# Patient Record
Sex: Female | Born: 1985 | Race: White | Hispanic: No | Marital: Single | Smoking: Never smoker
Health system: Southern US, Community
[De-identification: ages and names within clinical notes are randomized; demographics above are authoritative.]

## PROBLEM LIST (undated history)

## (undated) DIAGNOSIS — F429 Obsessive-compulsive disorder, unspecified: Secondary | ICD-10-CM

## (undated) DIAGNOSIS — D509 Iron deficiency anemia, unspecified: Secondary | ICD-10-CM

## (undated) DIAGNOSIS — Z9889 Other specified postprocedural states: Secondary | ICD-10-CM

## (undated) HISTORY — DX: Iron deficiency anemia, unspecified: D50.9

## (undated) HISTORY — PX: LASIK: SHX215

## (undated) HISTORY — DX: Other specified postprocedural states: Z98.890

## (undated) HISTORY — PX: APPENDECTOMY: SHX54

## (undated) HISTORY — DX: Obsessive-compulsive disorder, unspecified: F42.9

---

## 2016-12-20 DIAGNOSIS — Z760 Encounter for issue of repeat prescription: Secondary | ICD-10-CM | POA: Diagnosis not present

## 2017-03-12 DIAGNOSIS — Z760 Encounter for issue of repeat prescription: Secondary | ICD-10-CM | POA: Diagnosis not present

## 2017-03-25 ENCOUNTER — Ambulatory Visit: Payer: Self-pay | Admitting: Family Medicine

## 2017-04-23 ENCOUNTER — Encounter: Payer: Self-pay | Admitting: Family Medicine

## 2017-04-23 ENCOUNTER — Ambulatory Visit (INDEPENDENT_AMBULATORY_CARE_PROVIDER_SITE_OTHER): Payer: 59 | Admitting: Family Medicine

## 2017-04-23 VITALS — BP 122/82 | HR 87 | Ht 62.0 in | Wt 111.0 lb

## 2017-04-23 DIAGNOSIS — Z0000 Encounter for general adult medical examination without abnormal findings: Secondary | ICD-10-CM | POA: Diagnosis not present

## 2017-04-23 DIAGNOSIS — Z0289 Encounter for other administrative examinations: Secondary | ICD-10-CM | POA: Diagnosis not present

## 2017-04-23 MED ORDER — MEDROXYPROGESTERONE ACETATE 150 MG/ML IM SUSP: 150.0000 mg | INTRAMUSCULAR | 3 refills | Status: DC

## 2017-04-23 NOTE — Progress Notes (Signed)
Name: Kathleen Small   MRN: [id number]    DOB: 07/30/1985   Date:04/23/2017       Progress Note  Subjective  Chief Complaint  Chief Complaint  Patient presents with  . Establish Care    new to area from [place]- just establishing    Patient to establish care with new family physician.    No problem-specific Assessment & Plan notes found for this encounter.   Past Medical History:  Diagnosis Date  . Hx of abdominal surgery    malrotated abdomen    Past Surgical History:  Procedure Laterality Date  . APPENDECTOMY    . LASIK Bilateral     Family History  Problem Relation Age of Onset  . Diabetes Father   . Hypertension Father   . Heart disease Father   . Hypertension Maternal Grandmother   . Stroke Maternal Grandmother   . Diabetes Maternal Grandfather   . Hypertension Paternal Grandmother     Social History   Socioeconomic History  . Marital status: Single    Spouse name: Not on file  . Number of children: Not on file  . Years of education: Not on file  . Highest education level: Not on file  Social Needs  . Financial resource strain: Not on file  . Food insecurity - worry: Not on file  . Food insecurity - inability: Not on file  . Transportation needs - medical: Not on file  . Transportation needs - non-medical: Not on file  Occupational History  . Not on file  Tobacco Use  . Smoking status: Never Smoker  . Smokeless tobacco: Never Used  Substance and Sexual Activity  . Alcohol use: Yes  . Drug use: No  . Sexual activity: No  Other Topics Concern  . Not on file  Social History Narrative  . Not on file    Allergies  Allergen Reactions  . Reglan [Metoclopramide]     Outpatient Medications Prior to Visit  Medication Sig Dispense Refill  . medroxyPROGESTERone (DEPO-PROVERA) 150 MG/ML injection Inject 150 mg into the muscle every 3 (three) months.     No facility-administered medications prior to visit.     Review of Systems  Constitutional:  Negative for chills, diaphoresis, fever, malaise/fatigue and weight loss.  HENT: Negative for congestion, ear discharge, ear pain, hearing loss, nosebleeds, sinus pain, sore throat and tinnitus.   Eyes: Negative for blurred vision, double vision, photophobia, pain, discharge and redness.  Respiratory: Negative for cough, hemoptysis, sputum production, shortness of breath, wheezing and stridor.   Cardiovascular: Negative for chest pain, palpitations, orthopnea, claudication, leg swelling and PND.  Gastrointestinal: Negative for abdominal pain, blood in stool, constipation, diarrhea, heartburn, melena, nausea and vomiting.  Genitourinary: Negative for dysuria, flank pain, frequency, hematuria and urgency.  Musculoskeletal: Negative for back pain, joint pain, myalgias and neck pain.  Skin: Negative for itching and rash.  Neurological: Negative for dizziness, tingling, tremors, sensory change, speech change, focal weakness, seizures, loss of consciousness, weakness and headaches.  Endo/Heme/Allergies: Negative for environmental allergies and polydipsia. Does not bruise/bleed easily.  Psychiatric/Behavioral: Negative for depression and suicidal ideas. The patient is not nervous/anxious and does not have insomnia.      Objective  Vitals:   04/23/17 1525  BP: 122/82  Pulse: 87  SpO2: 100%  Weight: 111 lb (50.3 kg)  Height: 5\' 2"  (1.575 m)    Physical Exam  Constitutional: She is well-developed, well-nourished, and in no distress. No distress.  HENT:  Head: Normocephalic and  atraumatic.  Right Ear: External ear normal.  Left Ear: External ear normal.  Nose: Nose normal.  Mouth/Throat: Oropharynx is clear and moist.  Eyes: Conjunctivae and EOM are normal. Pupils are equal, round, and reactive to light. Right eye exhibits no discharge. Left eye exhibits no discharge.  Neck: Normal range of motion. Neck supple. No JVD present. No thyromegaly present.  Cardiovascular: Normal rate, regular  rhythm, S1 normal, S2 normal and intact distal pulses. Exam reveals no gallop, no S3, no S4 and no friction rub.  Murmur heard.  Systolic murmur is present with a grade of 1/6. Pulmonary/Chest: Effort normal and breath sounds normal. She has no wheezes. She has no rales.  Abdominal: Soft. Bowel sounds are normal. She exhibits no mass. There is no tenderness. There is no guarding.  Musculoskeletal: Normal range of motion. She exhibits no edema.  Lymphadenopathy:    She has no cervical adenopathy.  Neurological: She is alert.  Skin: Skin is warm and dry. She is not diaphoretic.  Psychiatric: Mood and affect normal.  Nursing note and vitals reviewed.     Assessment & Plan  Problem List Items Addressed This Visit    None    Visit Diagnoses    Encounter for physical examination related to employment    -  Primary   Encounter for medical examination to establish care          Meds ordered this encounter  Medications  . medroxyPROGESTERone (DEPO-PROVERA) 150 MG/ML injection    Sig: Inject 1 mL (150 mg total) into the muscle every 3 (three) months.    Dispense:  1 mL    Refill:  3      Dr. [facility]  04/23/17

## 2017-05-08 ENCOUNTER — Ambulatory Visit (INDEPENDENT_AMBULATORY_CARE_PROVIDER_SITE_OTHER): Payer: 59 | Admitting: Obstetrics and Gynecology

## 2017-05-08 ENCOUNTER — Encounter: Payer: Self-pay | Admitting: Obstetrics and Gynecology

## 2017-05-08 VITALS — BP 104/64 | HR 70 | Ht 62.0 in | Wt 110.0 lb

## 2017-05-08 DIAGNOSIS — Z3009 Encounter for other general counseling and advice on contraception: Secondary | ICD-10-CM | POA: Diagnosis not present

## 2017-05-08 DIAGNOSIS — Z0000 Encounter for general adult medical examination without abnormal findings: Secondary | ICD-10-CM | POA: Diagnosis not present

## 2017-05-08 DIAGNOSIS — Z124 Encounter for screening for malignant neoplasm of cervix: Secondary | ICD-10-CM | POA: Diagnosis not present

## 2017-05-08 NOTE — Progress Notes (Signed)
Gynecology Annual Exam  PCP: Duanne LimerickJones, Deanna C, MD  Chief Complaint:  Chief Complaint  Patient presents with  . Gynecologic Exam    NP history of abnormal pap    History of Present Illness: Patient is a 32 y.o. G0P0000 presents for annual exam. The patient has no complaints today. She would like to follow up an abnormal [place]. She has tried but been unable to obtain results.   LMP: No LMP recorded. Patient has had an injection. Average Interval: amenorrheic on depo Duration of flow: 0 days  Heavy Menses: no Clots: no Intermenstrual Bleeding: no Postcoital Bleeding: no Dysmenorrhea: no   The patient is not sexually active. She currently uses Depo-Provera injections for contraception. She denies dyspareunia.  The patient does not perform self breast exams.  There is notable family history of breast or ovarian cancer in her family (paternal grandmother with breast cancer in 3670s)  The patient wears seatbelts: yes.   The patient has regular exercise: yes.    The patient denies current symptoms of depression.    Review of Systems: Review of Systems  Constitutional: Negative for chills, fever, malaise/fatigue and weight loss.  HENT: Negative for congestion, hearing loss and sinus pain.   Eyes: Negative for blurred vision and double vision.  Respiratory: Negative for cough, sputum production, shortness of breath and wheezing.   Cardiovascular: Negative for chest pain, palpitations, orthopnea and leg swelling.  Gastrointestinal: Negative for abdominal pain, constipation, diarrhea, nausea and vomiting.  Genitourinary: Negative for dysuria, flank pain, frequency, hematuria and urgency.  Musculoskeletal: Negative for back pain, falls and joint pain.  Skin: Negative for itching and rash.  Neurological: Negative for dizziness and headaches.  Psychiatric/Behavioral: Negative for depression, substance abuse and suicidal ideas. The patient is not  nervous/anxious.     Past Medical History:  Past Medical History:  Diagnosis Date  . Hx of abdominal surgery    malrotated abdomen    Past Surgical History:  Past Surgical History:  Procedure Laterality Date  . APPENDECTOMY    . LASIK Bilateral     Gynecologic History:  No LMP recorded. Patient has had an injection. Contraception: Depo-Provera injections Last [place]: [place] were[place] of [place]. Was advised to have colposcopy. Obstetric History: G0P0000  Family History:  Family History  Problem Relation Age of Onset  . Diabetes Father   . Hypertension Father   . Heart disease Father   . Hypertension Maternal Grandmother   . Stroke Maternal Grandmother   . Diabetes Maternal Grandfather   . Hypertension Paternal Grandmother     Social History:  Social History   Socioeconomic History  . Marital status: Single    Spouse name: Not on file  . Number of children: Not on file  . Years of education: Not on file  . Highest education level: Not on file  Social Needs  . Financial resource strain: Not on file  . Food insecurity - worry: Not on file  . Food insecurity - inability: Not on file  . Transportation needs - medical: Not on file  . Transportation needs - non-medical: Not on file  Occupational History  . Not on file  Tobacco Use  . Smoking status: Never Smoker  . Smokeless tobacco: Never Used  Substance and Sexual Activity  . Alcohol use: Yes  . Drug use: No  . Sexual activity: Not Currently    Birth control/protection: Injection  Other  Topics Concern  . Not on file  Social History Narrative  . Not on file    Allergies:  Allergies  Allergen Reactions  . Reglan [Metoclopramide] Other (See Comments)    Formication (felt like insects crawling all over her)    Medications: Prior to Admission medications   Medication Sig Start Date End Date Taking? Authorizing Provider  medroxyPROGESTERone (DEPO-PROVERA) 150 MG/ML  injection Inject 1 mL (150 mg total) into the muscle every 3 (three) months. 04/23/17  Yes Duanne Limerick, MD    Physical Exam Vitals: Blood pressure 104/64, pulse 70, height 5\' 2"  (1.575 m), weight 110 lb (49.9 kg).  Physical Exam  Constitutional: She is oriented to person, place, and time. She appears well-developed.  Genitourinary: Vagina normal and uterus normal. There is no lesion on the right labia. There is no lesion on the left labia. Vagina exhibits no lesion. Right adnexum does not display mass. Left adnexum does not display mass. Cervix does not exhibit motion tenderness.  Genitourinary Comments: Very small cervix and uterus.  HENT:  Head: Normocephalic and atraumatic.  Eyes: EOM are normal.  Neck: Neck supple. No thyromegaly present.  Cardiovascular: Normal rate, regular rhythm and normal heart sounds.  Pulmonary/Chest: Effort normal and breath sounds normal. Right breast exhibits no inverted nipple, no mass, no nipple discharge and no skin change. Left breast exhibits no inverted nipple, no mass, no nipple discharge and no skin change.  Abdominal: Soft. Bowel sounds are normal. She exhibits no distension and no mass.  Neurological: She is alert and oriented to person, place, and time.  Skin: Skin is warm and dry.  Psychiatric: She has a normal mood and affect. Her behavior is normal. Judgment and thought content normal.  Vitals reviewed.    Female chaperone present for pelvic and breast  portions of the physical exam  Assessment: 32 y.o. G0P0000 routine annual exam  Plan: Problem List Items Addressed This Visit    None    Visit Diagnoses    Screening for cervical cancer    -  Primary   Relevant Orders   PapIG, HPV, rfx 16/18      1) STI screening was offered and declined  2)Cervical Cancer Screening- pap today  3) Contraception - Education given regarding options for contraception, including injectable contraception, IUD placement. Patient desires to continue  Depo therapy.  4) Routine healthcare maintenance including cholesterol, diabetes screening discussed managed by PCP

## 2017-05-10 LAB — PAPIG, HPV, RFX 16/18
HPV, HIGH-RISK: NEGATIVE
PAP SMEAR COMMENT: 0

## 2017-06-03 ENCOUNTER — Other Ambulatory Visit: Payer: Self-pay

## 2017-06-03 DIAGNOSIS — Z3042 Encounter for surveillance of injectable contraceptive: Secondary | ICD-10-CM

## 2017-06-03 MED ORDER — MEDROXYPROGESTERONE ACETATE 150 MG/ML IM SUSP: 150.0000 mg | INTRAMUSCULAR | 3 refills | Status: DC

## 2018-02-03 ENCOUNTER — Ambulatory Visit: Payer: 59 | Admitting: Family Medicine

## 2018-02-07 ENCOUNTER — Ambulatory Visit (INDEPENDENT_AMBULATORY_CARE_PROVIDER_SITE_OTHER): Payer: 59 | Admitting: Family Medicine

## 2018-02-07 ENCOUNTER — Ambulatory Visit: Payer: 59 | Admitting: Family Medicine

## 2018-02-07 ENCOUNTER — Encounter: Payer: Self-pay | Admitting: Family Medicine

## 2018-02-07 VITALS — BP 130/82 | HR 72 | Ht 62.0 in | Wt 107.0 lb

## 2018-02-07 DIAGNOSIS — F419 Anxiety disorder, unspecified: Secondary | ICD-10-CM

## 2018-02-07 MED ORDER — SERTRALINE HCL 25 MG PO TABS: 25.0000 mg | ORAL_TABLET | Freq: Every day | ORAL | 1 refills | Status: DC

## 2018-02-07 NOTE — Progress Notes (Signed)
Date:  02/07/2018   Name:  Kathleen Small   DOB:  [birth year]   MRN:  [id number]   Chief Complaint: Anxiety (work stressors and social anxiety PHQ=2. never been on med in the past) Anxiety  Presents for initial visit. Onset was 1 to 6 months ago. The problem has been gradually improving. Symptoms include depressed mood, excessive worry, irritability and nervous/anxious behavior. Patient reports no chest pain, compulsions, confusion, decreased concentration, dizziness, feeling of choking, hyperventilation, insomnia, nausea, panic or shortness of breath. The severity of symptoms is mild. The symptoms are aggravated by work stress.       Review of Systems  Constitutional: Positive for irritability. Negative for chills, fatigue, fever and unexpected weight change.  HENT: Negative for congestion, ear discharge, ear pain, rhinorrhea, sinus pressure, sneezing and sore throat.   Eyes: Negative for photophobia, pain, discharge, redness and itching.  Respiratory: Negative for cough, shortness of breath, wheezing and stridor.   Cardiovascular: Negative for chest pain.  Gastrointestinal: Negative for abdominal pain, blood in stool, constipation, diarrhea, nausea and vomiting.  Endocrine: Negative for cold intolerance, heat intolerance, polydipsia, polyphagia and polyuria.  Genitourinary: Negative for dysuria, flank pain, frequency, hematuria, menstrual problem, pelvic pain, urgency, vaginal bleeding and vaginal discharge.  Musculoskeletal: Negative for arthralgias, back pain and myalgias.  Skin: Negative for rash.  Allergic/Immunologic: Negative for environmental allergies and food allergies.  Neurological: Negative for dizziness, weakness, light-headedness, numbness and headaches.  Hematological: Negative for adenopathy. Does not bruise/bleed easily.  Psychiatric/Behavioral: Negative for confusion, decreased concentration and dysphoric mood. The patient is nervous/anxious. The patient does not  have insomnia.     There are no active problems to display for this patient.   Allergies  Allergen Reactions  . Reglan [Metoclopramide] Other (See Comments)    Formication (felt like insects crawling all over her)    Past Surgical History:  Procedure Laterality Date  . APPENDECTOMY    . LASIK Bilateral     Social History   Tobacco Use  . Smoking status: Never Smoker  . Smokeless tobacco: Never Used  Substance Use Topics  . Alcohol use: Yes  . Drug use: No     Medication list has been reviewed and updated.  Current Meds  Medication Sig  . medroxyPROGESTERone (DEPO-PROVERA) 150 MG/ML injection Inject 1 mL (150 mg total) into the muscle every 3 (three) months.    PHQ 2/9 Scores 02/07/2018 04/23/2017  PHQ - 2 Score 0 0  PHQ- 9 Score 2 0    Physical Exam  Constitutional: She is oriented to person, place, and time. She appears well-developed and well-nourished.  HENT:  Head: Normocephalic.  Right Ear: External ear normal.  Left Ear: External ear normal.  Mouth/Throat: Oropharynx is clear and moist.  Eyes: Pupils are equal, round, and reactive to light. Conjunctivae and EOM are normal. Lids are everted and swept, no foreign bodies found. Left eye exhibits no hordeolum. No foreign body present in the left eye. Right conjunctiva is not injected. Left conjunctiva is not injected. No scleral icterus.  Neck: Normal range of motion. Neck supple. No JVD present. No tracheal deviation present. No thyromegaly present.  Cardiovascular: Normal rate, regular rhythm, normal heart sounds and intact distal pulses. Exam reveals no gallop and no friction rub.  No murmur heard. Pulmonary/Chest: Effort normal and breath sounds normal. No respiratory distress. She has no wheezes. She has no rales.  Abdominal: Soft. Bowel sounds are normal. She exhibits no mass. There is no hepatosplenomegaly. There  is no tenderness. There is no rebound and no guarding.  Musculoskeletal: Normal range of  motion. She exhibits no edema or tenderness.  Lymphadenopathy:    She has no cervical adenopathy.  Neurological: She is alert and oriented to person, place, and time. She has normal strength. She displays normal reflexes. No cranial nerve deficit.  Skin: Skin is warm. No rash noted.  Psychiatric: She has a normal mood and affect. Her mood appears not anxious. She does not exhibit a depressed mood.  Nursing note and vitals reviewed.   BP 130/82   Pulse 72   Ht 5\' 2"  (1.575 m)   Wt 107 lb (48.5 kg)   BMI 19.57 kg/m   Assessment and Plan:  1. Anxiety New onset Initiate sertraline 12.5 daily for 2 weeks then 1 a day The patient is asked to return in  6-8 weeks.   Dr. [facility]  02/07/2018

## 2018-03-17 ENCOUNTER — Encounter: Payer: Self-pay | Admitting: Family Medicine

## 2018-05-08 ENCOUNTER — Other Ambulatory Visit: Payer: Self-pay | Admitting: Obstetrics and Gynecology

## 2018-05-08 ENCOUNTER — Other Ambulatory Visit: Payer: Self-pay | Admitting: Family Medicine

## 2018-05-08 DIAGNOSIS — Z30019 Encounter for initial prescription of contraceptives, unspecified: Secondary | ICD-10-CM

## 2018-05-08 MED ORDER — MEDROXYPROGESTERONE ACETATE 150 MG/ML IM SUSP: 150.0000 mg | INTRAMUSCULAR | 3 refills | Status: DC

## 2018-11-07 ENCOUNTER — Other Ambulatory Visit: Payer: Self-pay

## 2018-11-07 ENCOUNTER — Encounter: Payer: Self-pay | Admitting: Family Medicine

## 2018-11-07 ENCOUNTER — Ambulatory Visit (INDEPENDENT_AMBULATORY_CARE_PROVIDER_SITE_OTHER): Payer: 59 | Admitting: Family Medicine

## 2018-11-07 VITALS — BP 120/80 | HR 80 | Ht 64.0 in | Wt 107.0 lb

## 2018-11-07 DIAGNOSIS — Z8659 Personal history of other mental and behavioral disorders: Secondary | ICD-10-CM

## 2018-11-07 DIAGNOSIS — Z0000 Encounter for general adult medical examination without abnormal findings: Secondary | ICD-10-CM | POA: Diagnosis not present

## 2018-11-07 DIAGNOSIS — F419 Anxiety disorder, unspecified: Secondary | ICD-10-CM | POA: Diagnosis not present

## 2018-11-07 MED ORDER — SERTRALINE HCL 25 MG PO TABS: 25.0000 mg | ORAL_TABLET | Freq: Every day | ORAL | 1 refills | Status: DC

## 2018-11-07 NOTE — Progress Notes (Signed)
Date:  11/07/2018   Name:  Kathleen Small   DOB:  18-Oct-1985   MRN:  [id number]   Chief Complaint: Annual Exam (no pap)  Patient is a 33 year old female who presents for a comprehensive physical exam. The patient reports the following problems: anxiety/OCD. Health maintenance has been reviewed up to date. Anxiety Presents for follow-up visit. Symptoms include compulsions. Patient reports no confusion, decreased concentration, depressed mood, dizziness, excessive worry, insomnia, irritability, nausea, nervous/anxious behavior, shortness of breath or suicidal ideas. Symptoms occur most days.      Review of Systems  Constitutional: Negative.  Negative for chills, fatigue, fever, irritability and unexpected weight change.  HENT: Negative for congestion, ear discharge, ear pain, rhinorrhea, sinus pressure, sneezing and sore throat.   Eyes: Negative for photophobia, pain, discharge, redness and itching.  Respiratory: Negative for cough, shortness of breath, wheezing and stridor.   Gastrointestinal: Negative for abdominal pain, blood in stool, constipation, diarrhea, nausea and vomiting.  Endocrine: Negative for cold intolerance, heat intolerance, polydipsia, polyphagia and polyuria.  Genitourinary: Negative for dysuria, flank pain, frequency, hematuria, menstrual problem, pelvic pain, urgency, vaginal bleeding and vaginal discharge.  Musculoskeletal: Negative for arthralgias, back pain and myalgias.  Skin: Negative for rash.  Allergic/Immunologic: Negative for environmental allergies and food allergies.  Neurological: Negative for dizziness, weakness, light-headedness, numbness and headaches.  Hematological: Negative for adenopathy. Does not bruise/bleed easily.  Psychiatric/Behavioral: Negative for confusion, decreased concentration, dysphoric mood and suicidal ideas. The patient is not nervous/anxious and does not have insomnia.     There are no active problems to display for this  patient.   Allergies  Allergen Reactions  . Reglan [Metoclopramide] Other (See Comments)    Formication (felt like insects crawling all over her)    Past Surgical History:  Procedure Laterality Date  . APPENDECTOMY    . LASIK Bilateral     Social History   Tobacco Use  . Smoking status: Never Smoker  . Smokeless tobacco: Never Used  Substance Use Topics  . Alcohol use: Yes  . Drug use: No     Medication list has been reviewed and updated.  Current Meds  Medication Sig  . medroxyPROGESTERone (DEPO-PROVERA) 150 MG/ML injection Inject 1 mL (150 mg total) into the muscle every 3 (three) months.    PHQ 2/9 Scores 11/07/2018 02/07/2018 04/23/2017  PHQ - 2 Score 0 0 0  PHQ- 9 Score 0 2 0    BP Readings from Last 3 Encounters:  11/07/18 120/80  02/07/18 130/82  05/08/17 104/64    Physical Exam Vitals signs and nursing note reviewed.  Constitutional:      General: She is not in acute distress.    Appearance: Normal appearance. She is well-developed and well-groomed. She is not diaphoretic.  HENT:     Head: Normocephalic and atraumatic.     Jaw: There is normal jaw occlusion.     Right Ear: Hearing, tympanic membrane, ear canal and external ear normal.     Left Ear: Hearing, tympanic membrane, ear canal and external ear normal.     Nose: Nose normal. No nasal deformity or septal deviation.     Mouth/Throat:     Lips: Pink.     Mouth: Mucous membranes are moist.     Dentition: Normal dentition.     Pharynx: Oropharynx is clear. Uvula midline.  Eyes:     General: Lids are normal. Vision grossly intact. Gaze aligned appropriately.        Right  eye: No discharge.        Left eye: No discharge.     Extraocular Movements: Extraocular movements intact.     Conjunctiva/sclera: Conjunctivae normal.     Pupils: Pupils are equal, round, and reactive to light.  Neck:     Musculoskeletal: Full passive range of motion without pain, normal range of motion and neck supple.      Thyroid: No thyroid mass, thyromegaly or thyroid tenderness.     Vascular: Normal carotid pulses. No carotid bruit, hepatojugular reflux or JVD.     Trachea: Trachea and phonation normal.  Cardiovascular:     Rate and Rhythm: Normal rate and regular rhythm.  No extrasystoles are present.    Chest Wall: PMI is not displaced.     Pulses: Normal pulses.          Carotid pulses are 2+ on the right side and 2+ on the left side.      Radial pulses are 2+ on the right side and 2+ on the left side.       Femoral pulses are 2+ on the right side and 2+ on the left side.      Popliteal pulses are 2+ on the right side and 2+ on the left side.       Dorsalis pedis pulses are 2+ on the right side and 2+ on the left side.       Posterior tibial pulses are 2+ on the right side and 2+ on the left side.     Heart sounds: Normal heart sounds, S1 normal and S2 normal. No murmur. No systolic murmur. No diastolic murmur. No friction rub. No gallop. No S3 or S4 sounds.   Pulmonary:     Effort: Pulmonary effort is normal.     Breath sounds: Normal breath sounds. No decreased air movement. No decreased breath sounds, wheezing, rhonchi or rales.  Abdominal:     General: Bowel sounds are normal.     Palpations: Abdomen is soft. There is no hepatomegaly, splenomegaly or mass.     Tenderness: There is no abdominal tenderness. There is no right CVA tenderness, left CVA tenderness or guarding.  Musculoskeletal: Normal range of motion.     Right lower leg: No edema.     Left lower leg: No edema.     Right foot: Normal range of motion. No deformity.     Left foot: Normal range of motion. No deformity.  Feet:     Right foot:     Skin integrity: Skin integrity normal.     Left foot:     Skin integrity: Skin integrity normal.     Comments: Small spot between 4-5 toe right/unchanged Lymphadenopathy:     Head:     Right side of head: No submental, submandibular or tonsillar adenopathy.     Left side of head: No  submental, submandibular or tonsillar adenopathy.     Cervical: No cervical adenopathy.     Right cervical: No superficial, deep or posterior cervical adenopathy.    Left cervical: No superficial, deep or posterior cervical adenopathy.     Upper Body:     Right upper body: No supraclavicular adenopathy.     Left upper body: No supraclavicular adenopathy.  Skin:    General: Skin is warm and dry.     Capillary Refill: Capillary refill takes less than 2 seconds.  Neurological:     General: No focal deficit present.     Mental Status: She is alert.  Cranial Nerves: Cranial nerves are intact.     Sensory: Sensation is intact.     Motor: Motor function is intact.     Deep Tendon Reflexes: Reflexes are normal and symmetric.     Reflex Scores:      Tricep reflexes are 2+ on the right side and 2+ on the left side.      Bicep reflexes are 2+ on the right side and 2+ on the left side.      Brachioradialis reflexes are 2+ on the right side and 2+ on the left side.      Patellar reflexes are 2+ on the right side and 2+ on the left side.      Achilles reflexes are 2+ on the right side and 2+ on the left side. Psychiatric:        Behavior: Behavior is cooperative.     Wt Readings from Last 3 Encounters:  11/07/18 107 lb (48.5 kg)  02/07/18 107 lb (48.5 kg)  05/08/17 110 lb (49.9 kg)    BP 120/80   Pulse 80   Ht 5\' 4"  (1.626 m)   Wt 107 lb (48.5 kg)   BMI 18.37 kg/m   Assessment and Plan:  1. Annual physical exam No subjective/objective concerns noted during history or physical exam.  Patient's previous encounter was reviewed as was her most recent labs and concerns.  Will obtain a renal function panel lipid panel and hemoglobin for baseline.Janalyn HarderSamantha Susman is a 33 y.o. female who presents today for her Complete Annual Exam. She feels well. She reports exercising . She reports she is sleeping well.  - Renal function panel - Lipid panel - Hemoglobin  2. Anxiety Controlled.   Chronic however has not been taking sertraline but feels better on it and we will resume sertraline 25 mg once a day. - sertraline (ZOLOFT) 25 MG tablet; Take 1 tablet (25 mg total) by mouth daily.  Dispense: 90 tablet; Refill: 1  3. History of OCD (obsessive compulsive disorder) Patient has a history of OCD and has concerned that there may be breakthrough when she is now in the process of start of a new career beginning.  As noted we will continue sertraline 25 mg a day - sertraline (ZOLOFT) 25 MG tablet; Take 1 tablet (25 mg total) by mouth daily.  Dispense: 90 tablet; Refill: 1

## 2018-11-10 ENCOUNTER — Telehealth: Payer: Self-pay

## 2018-11-10 NOTE — Telephone Encounter (Signed)
Pt called in asking about the 25mg  of sertraline. She is to take 2) 25mg  tabs= 50mg  for 2 weeks. If she tolerates the increase on the med- we will send in the 50mg  tabs. If for some reason she doesn't, she will have the 25s to fall back down to. Pt voiced understanding and will let me know either way in 2 weeks

## 2018-12-25 ENCOUNTER — Other Ambulatory Visit: Payer: Self-pay

## 2018-12-25 ENCOUNTER — Encounter: Payer: Self-pay | Admitting: Family Medicine

## 2018-12-25 DIAGNOSIS — Z8659 Personal history of other mental and behavioral disorders: Secondary | ICD-10-CM

## 2018-12-25 DIAGNOSIS — F419 Anxiety disorder, unspecified: Secondary | ICD-10-CM

## 2018-12-25 MED ORDER — SERTRALINE HCL 50 MG PO TABS: 50.0000 mg | ORAL_TABLET | Freq: Every day | ORAL | 0 refills | Status: DC

## 2018-12-26 ENCOUNTER — Telehealth: Payer: Self-pay

## 2018-12-26 ENCOUNTER — Other Ambulatory Visit: Payer: Self-pay

## 2018-12-26 ENCOUNTER — Telehealth: Payer: Self-pay | Admitting: Family Medicine

## 2018-12-26 DIAGNOSIS — F419 Anxiety disorder, unspecified: Secondary | ICD-10-CM

## 2018-12-26 DIAGNOSIS — Z8659 Personal history of other mental and behavioral disorders: Secondary | ICD-10-CM

## 2018-12-26 MED ORDER — SERTRALINE HCL 50 MG PO TABS: 50.0000 mg | ORAL_TABLET | Freq: Every day | ORAL | 0 refills | Status: DC

## 2018-12-26 NOTE — Telephone Encounter (Signed)
Kathleen Small made her appointment for November to come in for med refill she mention that you would send a refill for zoloft for 90days.

## 2018-12-26 NOTE — Telephone Encounter (Signed)
Not what I said at all. The message I sent to her said Will send in a 30 days supply- need to see you within next 30 days, THEN we will be glad to change to a 90 day supply, meaning AFTER seeing her within the next 30 days

## 2018-12-26 NOTE — Telephone Encounter (Signed)
Pt is wanting to know if our office can manage her prescription for the zoloft because the place she goes to now makes her go to appt very frequently and she is inquiring how many time we would request her to come see [place] to keep giving the medication to her. Please advise, this is non urgent. Thank you

## 2019-01-02 ENCOUNTER — Telehealth: Payer: Self-pay

## 2019-01-02 NOTE — Telephone Encounter (Signed)
Pt calling; wants to know if [facility] would be willing to take over her zoloft rx; if not, would like to tx her care to [facility].  [phone]

## 2019-01-02 NOTE — Telephone Encounter (Signed)
I can give a yearly refill at her annual. She is actively managing this with her PCP though so I am not inclined to intervene.

## 2019-01-02 NOTE — Telephone Encounter (Signed)
Pt message sent in mychart about this.

## 2019-03-09 ENCOUNTER — Encounter: Payer: Self-pay | Admitting: Family Medicine

## 2019-03-09 ENCOUNTER — Other Ambulatory Visit: Payer: Self-pay

## 2019-03-09 ENCOUNTER — Ambulatory Visit (INDEPENDENT_AMBULATORY_CARE_PROVIDER_SITE_OTHER): Payer: 59 | Admitting: Family Medicine

## 2019-03-09 VITALS — BP 120/64 | HR 80 | Ht 62.0 in | Wt 106.0 lb

## 2019-03-09 DIAGNOSIS — Z8659 Personal history of other mental and behavioral disorders: Secondary | ICD-10-CM | POA: Diagnosis not present

## 2019-03-09 DIAGNOSIS — F419 Anxiety disorder, unspecified: Secondary | ICD-10-CM

## 2019-03-09 MED ORDER — SERTRALINE HCL 50 MG PO TABS: 75.0000 mg | ORAL_TABLET | Freq: Every day | ORAL | 1 refills | Status: DC

## 2019-03-09 NOTE — Progress Notes (Signed)
Date:  03/09/2019   Name:  Kathleen Small   DOB:  1985-09-18   MRN:  [id number]   Chief Complaint: Anxiety (PHQ9=2 and GAD7=8)  Anxiety Presents for follow-up visit. Symptoms include excessive worry, irritability and nervous/anxious behavior. Patient reports no chest pain, compulsions, confusion, decreased concentration, depressed mood, dizziness, dry mouth, feeling of choking, hyperventilation, impotence, insomnia, malaise, muscle tension, nausea, obsessions, palpitations, panic, restlessness, shortness of breath or suicidal ideas. Symptoms occur occasionally. The severity of symptoms is moderate. The quality of sleep is good.      No results found for: CREATININE, BUN, NA, K, CL, CO2 No results found for: CHOL, HDL, LDLCALC, LDLDIRECT, TRIG, CHOLHDL No results found for: TSH No results found for: HGBA1C   Review of Systems  Constitutional: Positive for irritability. Negative for chills, fatigue, fever and unexpected weight change.  HENT: Negative for congestion, ear discharge, ear pain, rhinorrhea, sinus pressure, sneezing and sore throat.   Eyes: Negative for photophobia, pain, discharge, redness and itching.  Respiratory: Negative for cough, chest tightness, shortness of breath, wheezing and stridor.   Cardiovascular: Negative for chest pain, palpitations and leg swelling.  Gastrointestinal: Negative for abdominal pain, blood in stool, constipation, diarrhea, nausea and vomiting.  Endocrine: Negative for cold intolerance, heat intolerance, polydipsia, polyphagia and polyuria.  Genitourinary: Negative for dysuria, flank pain, frequency, hematuria, impotence, menstrual problem, pelvic pain, urgency, vaginal bleeding and vaginal discharge.  Musculoskeletal: Negative for arthralgias, back pain and myalgias.  Skin: Negative for rash.  Allergic/Immunologic: Negative for environmental allergies and food allergies.  Neurological: Negative for dizziness, weakness, light-headedness,  numbness and headaches.  Hematological: Negative for adenopathy. Does not bruise/bleed easily.  Psychiatric/Behavioral: Negative for confusion, decreased concentration, dysphoric mood and suicidal ideas. The patient is nervous/anxious. The patient does not have insomnia.     There are no active problems to display for this patient.   Allergies  Allergen Reactions  . Reglan [Metoclopramide] Other (See Comments)    Formication (felt like insects crawling all over her)    Past Surgical History:  Procedure Laterality Date  . APPENDECTOMY    . LASIK Bilateral     Social History   Tobacco Use  . Smoking status: Never Smoker  . Smokeless tobacco: Never Used  Substance Use Topics  . Alcohol use: Yes  . Drug use: No     Medication list has been reviewed and updated.  Current Meds  Medication Sig  . medroxyPROGESTERone (DEPO-PROVERA) 150 MG/ML injection Inject 1 mL (150 mg total) into the muscle every 3 (three) months.  . sertraline (ZOLOFT) 50 MG tablet Take 1 tablet (50 mg total) by mouth daily.    PHQ 2/9 Scores 03/09/2019 11/07/2018 02/07/2018 04/23/2017  PHQ - 2 Score 1 0 0 0  PHQ- 9 Score 2 0 2 0    BP Readings from Last 3 Encounters:  03/09/19 120/64  11/07/18 120/80  02/07/18 130/82    Physical Exam Vitals signs and nursing note reviewed.  Constitutional:      Appearance: She is well-developed.  HENT:     Head: Normocephalic.     Right Ear: Tympanic membrane, ear canal and external ear normal.     Left Ear: Tympanic membrane, ear canal and external ear normal.     Nose: Nose normal.  Eyes:     General: Lids are everted, no foreign bodies appreciated. No scleral icterus.       Left eye: No foreign body or hordeolum.     Conjunctiva/sclera: Conjunctivae  normal.     Right eye: Right conjunctiva is not injected.     Left eye: Left conjunctiva is not injected.     Pupils: Pupils are equal, round, and reactive to light.  Neck:     Musculoskeletal: Normal range  of motion and neck supple.     Thyroid: No thyromegaly.     Vascular: No JVD.     Trachea: No tracheal deviation.  Cardiovascular:     Rate and Rhythm: Normal rate and regular rhythm.     Heart sounds: Normal heart sounds. No murmur. No friction rub. No gallop.   Pulmonary:     Effort: Pulmonary effort is normal. No respiratory distress.     Breath sounds: Normal breath sounds. No wheezing or rales.  Abdominal:     General: Bowel sounds are normal.     Palpations: Abdomen is soft. There is no shifting dullness, hepatomegaly, splenomegaly or mass.     Tenderness: There is no abdominal tenderness. There is no guarding or rebound.  Musculoskeletal: Normal range of motion.        General: No tenderness.  Lymphadenopathy:     Cervical: No cervical adenopathy.  Skin:    General: Skin is warm.     Findings: No rash.  Neurological:     Mental Status: She is alert and oriented to person, place, and time.     Cranial Nerves: No cranial nerve deficit.     Deep Tendon Reflexes: Reflexes normal.  Psychiatric:        Mood and Affect: Mood is not anxious or depressed.     Wt Readings from Last 3 Encounters:  03/09/19 106 lb (48.1 kg)  11/07/18 107 lb (48.5 kg)  02/07/18 107 lb (48.5 kg)    BP 120/64   Pulse 80   Ht 5\' 2"  (1.575 m)   Wt 106 lb (48.1 kg)   BMI 19.39 kg/m   Assessment and Plan: 1. Anxiety Chronic.  Persistent.  Stable.  Controlled patient is currently on 50 mg of Zoloft but the gad score today is 6 mostly due to anxiety excessive worry and irritability/frustration.  Patient will increase to 75 mg 1-1/2 tablet on as-needed basis. - sertraline (ZOLOFT) 50 MG tablet; Take 1.5 tablets (75 mg total) by mouth daily.  Dispense: 135 tablet; Refill: 1  2. History of OCD (obsessive compulsive disorder) Patient has a history of OCD which is under reasonable control with the Zoloft. - sertraline (ZOLOFT) 50 MG tablet; Take 1.5 tablets (75 mg total) by mouth daily.  Dispense: 135  tablet; Refill: 1

## 2019-04-14 ENCOUNTER — Other Ambulatory Visit: Payer: Self-pay | Admitting: Obstetrics and Gynecology

## 2019-04-14 ENCOUNTER — Other Ambulatory Visit: Payer: Self-pay

## 2019-04-14 DIAGNOSIS — Z30019 Encounter for initial prescription of contraceptives, unspecified: Secondary | ICD-10-CM

## 2019-04-14 MED ORDER — MEDROXYPROGESTERONE ACETATE 150 MG/ML IM SUSP: 150.0000 mg | INTRAMUSCULAR | 3 refills | Status: DC

## 2019-05-18 ENCOUNTER — Ambulatory Visit: Payer: 59 | Admitting: Obstetrics and Gynecology

## 2019-05-28 ENCOUNTER — Ambulatory Visit: Payer: 59 | Admitting: Obstetrics and Gynecology

## 2019-06-10 ENCOUNTER — Other Ambulatory Visit: Payer: Self-pay

## 2019-06-10 ENCOUNTER — Ambulatory Visit (INDEPENDENT_AMBULATORY_CARE_PROVIDER_SITE_OTHER): Payer: 59 | Admitting: Obstetrics and Gynecology

## 2019-06-10 ENCOUNTER — Encounter: Payer: Self-pay | Admitting: Obstetrics and Gynecology

## 2019-06-10 VITALS — BP 100/60 | Ht 62.0 in | Wt 106.0 lb

## 2019-06-10 DIAGNOSIS — Z30017 Encounter for initial prescription of implantable subdermal contraceptive: Secondary | ICD-10-CM | POA: Diagnosis not present

## 2019-06-10 DIAGNOSIS — N6312 Unspecified lump in the right breast, upper inner quadrant: Secondary | ICD-10-CM

## 2019-06-10 DIAGNOSIS — Z01419 Encounter for gynecological examination (general) (routine) without abnormal findings: Secondary | ICD-10-CM

## 2019-06-10 DIAGNOSIS — Z1239 Encounter for other screening for malignant neoplasm of breast: Secondary | ICD-10-CM

## 2019-06-10 DIAGNOSIS — Z0000 Encounter for general adult medical examination without abnormal findings: Secondary | ICD-10-CM

## 2019-06-10 DIAGNOSIS — Z01411 Encounter for gynecological examination (general) (routine) with abnormal findings: Secondary | ICD-10-CM

## 2019-06-10 DIAGNOSIS — Z3009 Encounter for other general counseling and advice on contraception: Secondary | ICD-10-CM

## 2019-06-10 NOTE — Patient Instructions (Signed)
Etonogestrel implant What is this medicine? ETONOGESTREL (et oh noe JES trel) is a contraceptive (birth control) device. It is used to prevent pregnancy. It can be used for up to 3 years. This medicine may be used for other purposes; ask your health care provider or pharmacist if you have questions. COMMON BRAND NAME(S): Implanon, Nexplanon What should I tell my health care provider before I take this medicine? They need to know if you have any of these conditions:  abnormal vaginal bleeding  blood vessel disease or blood clots  breast, cervical, endometrial, ovarian, liver, or uterine cancer  diabetes  gallbladder disease  heart disease or recent heart attack  high blood pressure  high cholesterol or triglycerides  kidney disease  liver disease  migraine headaches  seizures  stroke  tobacco smoker  an unusual or allergic reaction to etonogestrel, anesthetics or antiseptics, other medicines, foods, dyes, or preservatives  pregnant or trying to get pregnant  breast-feeding How should I use this medicine? This device is inserted just under the skin on the inner side of your upper arm by a health care professional. Talk to your pediatrician regarding the use of this medicine in children. Special care may be needed. Overdosage: If you think you have taken too much of this medicine contact a poison control center or emergency room at once. NOTE: This medicine is only for you. Do not share this medicine with others. What if I miss a dose? This does not apply. What may interact with this medicine? Do not take this medicine with any of the following medications:  amprenavir  fosamprenavir This medicine may also interact with the following medications:  acitretin  aprepitant  armodafinil  bexarotene  bosentan  carbamazepine  certain medicines for fungal infections like fluconazole, ketoconazole, itraconazole and voriconazole  certain medicines to treat  hepatitis, HIV or AIDS  cyclosporine  felbamate  griseofulvin  lamotrigine  modafinil  oxcarbazepine  phenobarbital  phenytoin  primidone  rifabutin  rifampin  rifapentine  St. John's wort  topiramate This list may not describe all possible interactions. Give your health care provider a list of all the medicines, herbs, non-prescription drugs, or dietary supplements you use. Also tell them if you smoke, drink alcohol, or use illegal drugs. Some items may interact with your medicine. What should I watch for while using this medicine? This product does not protect you against HIV infection (AIDS) or other sexually transmitted diseases. You should be able to feel the implant by pressing your fingertips over the skin where it was inserted. Contact your doctor if you cannot feel the implant, and use a non-hormonal birth control method (such as condoms) until your doctor confirms that the implant is in place. Contact your doctor if you think that the implant may have broken or become bent while in your arm. You will receive a user card from your health care provider after the implant is inserted. The card is a record of the location of the implant in your upper arm and when it should be removed. Keep this card with your health records. What side effects may I notice from receiving this medicine? Side effects that you should report to your doctor or health care professional as soon as possible:  allergic reactions like skin rash, itching or hives, swelling of the face, lips, or tongue  breast lumps, breast tissue changes, or discharge  breathing problems  changes in emotions or moods  coughing up blood  if you feel that the implant   may have broken or bent while in your arm  high blood pressure  pain, irritation, swelling, or bruising at the insertion site  scar at site of insertion  signs of infection at the insertion site such as fever, and skin redness, pain or  discharge  signs and symptoms of a blood clot such as breathing problems; changes in vision; chest pain; severe, sudden headache; pain, swelling, warmth in the leg; trouble speaking; sudden numbness or weakness of the face, arm or leg  signs and symptoms of liver injury like dark yellow or brown urine; general ill feeling or flu-like symptoms; light-colored stools; loss of appetite; nausea; right upper belly pain; unusually weak or tired; yellowing of the eyes or skin  unusual vaginal bleeding, discharge Side effects that usually do not require medical attention (report to your doctor or health care professional if they continue or are bothersome):  acne  breast pain or tenderness  headache  irregular menstrual bleeding  nausea This list may not describe all possible side effects. Call your doctor for medical advice about side effects. You may report side effects to FDA at 1-800-FDA-1088. Where should I keep my medicine? This drug is given in a hospital or clinic and will not be stored at home. NOTE: This sheet is a summary. It may not cover all possible information. If you have questions about this medicine, talk to your doctor, pharmacist, or health care provider.  2020 Elsevier/Gold Standard (2019-01-13 11:33:04)    Nexplanon Instructions After Insertion   Keep bandage clean and dry for 24 hours   May use ice/Tylenol/Ibuprofen for soreness or pain   If you develop fever, drainage or increased warmth from incision site-contact office immediately   

## 2019-06-10 NOTE — Progress Notes (Addendum)
Gynecology Annual Exam   PCP: Duanne Limerick, MD  Chief Complaint:  Chief Complaint  Patient presents with  . Gynecologic Exam    Talk about when she needs to change DEPO    History of Present Illness: Patient is a 34 y.o. G0P0000 presents for annual exam. The patient has no complaints today.   LMP: No LMP recorded. Patient has had an injection. No bleeding, no spotting  The patient is not currently sexually active. She currently uses Depo-Provera injections for contraception. She denies dyspareunia.  There is notable family history of breast or ovarian cancer in her family.  The patient wears seatbelts: yes.   The patient has regular exercise: yes.    The patient denies current symptoms of depression.  She ihas anxiety but feels it is much improved with zoloft.   Review of Systems: Review of Systems  Constitutional: Negative for chills, fever, malaise/fatigue and weight loss.  HENT: Negative for congestion, hearing loss and sinus pain.   Eyes: Negative for blurred vision and double vision.  Respiratory: Negative for cough, sputum production, shortness of breath and wheezing.   Cardiovascular: Negative for chest pain, palpitations, orthopnea and leg swelling.  Gastrointestinal: Negative for abdominal pain, constipation, diarrhea, nausea and vomiting.  Genitourinary: Negative for dysuria, flank pain, frequency, hematuria and urgency.  Musculoskeletal: Negative for back pain, falls and joint pain.  Skin: Negative for itching and rash.  Neurological: Negative for dizziness and headaches.  Psychiatric/Behavioral: Negative for depression, substance abuse and suicidal ideas. The patient is not nervous/anxious.     Past Medical History:  Past Medical History:  Diagnosis Date  . Hx of abdominal surgery    malrotated abdomen    Past Surgical History:  Past Surgical History:  Procedure Laterality Date  . APPENDECTOMY    . LASIK Bilateral     Gynecologic History:  No  LMP recorded. Patient has had an injection. Contraception: Depo-Provera injections Last Pap: Results were: NIL and HR HPV negative   Obstetric History: G0P0000  Family History:  Family History  Problem Relation Age of Onset  . Diabetes Father   . Hypertension Father   . Heart disease Father   . Hypertension Maternal Grandmother   . Stroke Maternal Grandmother   . Diabetes Maternal Grandfather   . Hypertension Paternal Grandmother     Social History:  Social History   Socioeconomic History  . Marital status: Single    Spouse name: Not on file  . Number of children: Not on file  . Years of education: Not on file  . Highest education level: Not on file  Occupational History  . Not on file  Tobacco Use  . Smoking status: Never Smoker  . Smokeless tobacco: Never Used  Substance and Sexual Activity  . Alcohol use: Yes  . Drug use: No  . Sexual activity: Not Currently    Birth control/protection: Injection  Other Topics Concern  . Not on file  Social History Narrative  . Not on file   Social Determinants of Health   Financial Resource Strain:   . Difficulty of Paying Living Expenses: Not on file  Food Insecurity:   . Worried About Programme researcher, broadcasting/film/video in the Last Year: Not on file  . Ran Out of Food in the Last Year: Not on file  Transportation Needs:   . Lack of Transportation (Medical): Not on file  . Lack of Transportation (Non-Medical): Not on file  Physical Activity:   . Days  of Exercise per Week: Not on file  . Minutes of Exercise per Session: Not on file  Stress:   . Feeling of Stress : Not on file  Social Connections:   . Frequency of Communication with Friends and Family: Not on file  . Frequency of Social Gatherings with Friends and Family: Not on file  . Attends Religious Services: Not on file  . Active Member of Clubs or Organizations: Not on file  . Attends Archivist Meetings: Not on file  . Marital Status: Not on file  Intimate Partner  Violence:   . Fear of Current or Ex-Partner: Not on file  . Emotionally Abused: Not on file  . Physically Abused: Not on file  . Sexually Abused: Not on file    Allergies:  Allergies  Allergen Reactions  . Reglan [Metoclopramide] Other (See Comments)    Formication (felt like insects crawling all over her)    Medications: Prior to Admission medications   Medication Sig Start Date End Date Taking? Authorizing Provider  medroxyPROGESTERone (DEPO-PROVERA) 150 MG/ML injection Inject 1 mL (150 mg total) into the muscle every 3 (three) months. 04/14/19 07/13/19 Yes Antiono Ettinger R, MD  sertraline (ZOLOFT) 50 MG tablet Take 1.5 tablets (75 mg total) by mouth daily. 03/09/19  Yes Juline Patch, MD    Physical Exam Vitals: Blood pressure 100/60, height 5\' 2"  (1.575 m), weight 106 lb (48.1 kg).  General: NAD HEENT: normocephalic, anicteric Thyroid: no enlargement, no palpable nodules Pulmonary: No increased work of breathing, CTAB Cardiovascular: RRR, distal pulses 2+ Breast: Breast symmetrical, no tenderness. Palpable breast lump at 1 o'clock position in right breast. No skin or nipple retraction present, no nipple discharge.  No axillary or supraclavicular lymphadenopathy. Abdomen: NABS, soft, non-tender, non-distended.  Umbilicus without lesions.  No hepatomegaly, splenomegaly or masses palpable. No evidence of hernia  Genitourinary:  External: Normal external female genitalia.  Normal urethral meatus, normal Bartholin's and Skene's glands.    Vagina: Normal vaginal mucosa, no evidence of prolapse.    Cervix: Grossly normal in appearance, no bleeding  Uterus: Non-enlarged, mobile, normal contour.  No CMT  Adnexa: ovaries non-enlarged, no adnexal masses  Rectal: deferred  Lymphatic: no evidence of inguinal lymphadenopathy Extremities: no edema, erythema, or tenderness Neurologic: Grossly intact Psychiatric: mood appropriate, affect full  Female chaperone present for pelvic  and breast  portions of the physical exam  Nexplanon Insertion  Patient given informed consent, signed copy in the chart, time out was performed. Patient is within 12 weeks of Depo Provera.Appropriate time out taken.  Patient's left arm was prepped and draped in the usual sterile fashion.. The ruler used to measure and mark insertion area.  Pt was prepped with betadine swab and then injected with 5 cc of 2% lidocaine with epinephrine. Nexplanon removed form packaging,  Device confirmed in needle, then inserted full length of needle and withdrawn per handbook instructions.  Pt insertion site covered with steri-strip and a bandage.   Minimal blood loss.  Pt tolerated the procedure well.    Assessment: 34 y.o. G0P0000 routine annual exam  Plan: Problem List Items Addressed This Visit    None      2) STI screening  wasoffered and declined  2)  ASCCP guidelines and rational discussed.  Patient opts for every 5 years screening interval  3) Contraception - the patient is currently using  Depo-Provera injections.  She is interested in changing to [place]. Nexplanon inserted today.   4) Routine healthcare maintenance  including cholesterol, diabetes screening discussed managed by PCP  5) No follow-ups on file.  Adelene Idler MD [facility], [facility] 06/10/2019 4:14 PM

## 2019-06-19 ENCOUNTER — Ambulatory Visit
Admission: RE | Admit: 2019-06-19 | Discharge: 2019-06-19 | Disposition: A | Discharge status: Home / Self Care | Payer: 59 | Source: Ambulatory Visit | Attending: Obstetrics and Gynecology | Admitting: Obstetrics and Gynecology

## 2019-06-19 DIAGNOSIS — N6489 Other specified disorders of breast: Secondary | ICD-10-CM | POA: Diagnosis not present

## 2019-06-19 DIAGNOSIS — N6312 Unspecified lump in the right breast, upper inner quadrant: Secondary | ICD-10-CM | POA: Diagnosis not present

## 2019-06-19 DIAGNOSIS — R922 Inconclusive mammogram: Secondary | ICD-10-CM | POA: Diagnosis not present

## 2019-09-07 ENCOUNTER — Encounter: Payer: Self-pay | Admitting: Family Medicine

## 2019-09-07 ENCOUNTER — Ambulatory Visit (INDEPENDENT_AMBULATORY_CARE_PROVIDER_SITE_OTHER): Payer: 59 | Admitting: Family Medicine

## 2019-09-07 ENCOUNTER — Other Ambulatory Visit: Payer: Self-pay

## 2019-09-07 VITALS — BP 124/62 | HR 80 | Ht 62.0 in | Wt 102.0 lb

## 2019-09-07 DIAGNOSIS — F419 Anxiety disorder, unspecified: Secondary | ICD-10-CM | POA: Diagnosis not present

## 2019-09-07 DIAGNOSIS — Z8659 Personal history of other mental and behavioral disorders: Secondary | ICD-10-CM | POA: Diagnosis not present

## 2019-09-07 MED ORDER — SERTRALINE HCL 100 MG PO TABS: 100.0000 mg | ORAL_TABLET | Freq: Every day | ORAL | 0 refills | Status: DC

## 2019-09-07 NOTE — Progress Notes (Signed)
Date:  09/07/2019   Name:  Kathleen Small   DOB:  08/07/1985   MRN:  [id number]   Chief Complaint: Follow-up (OCD)  Depression        This is a chronic problem.  The current episode started more than 1 year ago.   The onset quality is gradual.   The problem occurs intermittently.  The problem has been gradually improving since onset.  Associated symptoms include decreased concentration, fatigue, helplessness, irritable and sad.  Associated symptoms include no hopelessness, does not have insomnia, no restlessness, no decreased interest, no appetite change, no body aches, no myalgias, no headaches, no indigestion and no suicidal ideas.  Past treatments include SSRIs - Selective serotonin reuptake inhibitors.  Compliance with treatment is good.  Previous treatment provided mild relief.  Past medical history includes anxiety.   Anxiety Presents for follow-up visit. Symptoms include decreased concentration, depressed mood, excessive worry and nervous/anxious behavior. Patient reports no chest pain, compulsions, confusion, dizziness, dry mouth, feeling of choking, hyperventilation, impotence, insomnia, irritability, malaise, muscle tension, nausea, obsessions, palpitations, panic, restlessness, shortness of breath or suicidal ideas. Symptoms occur most days.      No results found for: CREATININE, BUN, NA, K, CL, CO2 No results found for: CHOL, HDL, LDLCALC, LDLDIRECT, TRIG, CHOLHDL No results found for: TSH No results found for: HGBA1C No results found for: WBC, HGB, HCT, MCV, PLT No results found for: ALT, AST, GGT, ALKPHOS, BILITOT   Review of Systems  Constitutional: Positive for fatigue. Negative for appetite change, chills, fever and irritability.  HENT: Negative for drooling, ear discharge, ear pain and sore throat.   Respiratory: Negative for cough, shortness of breath and wheezing.   Cardiovascular: Negative for chest pain, palpitations and leg swelling.  Gastrointestinal: Negative  for abdominal pain, blood in stool, constipation, diarrhea and nausea.  Endocrine: Negative for polydipsia.  Genitourinary: Negative for dysuria, frequency, hematuria, impotence and urgency.  Musculoskeletal: Negative for back pain, myalgias and neck pain.  Skin: Negative for rash.  Allergic/Immunologic: Negative for environmental allergies.  Neurological: Negative for dizziness and headaches.  Hematological: Does not bruise/bleed easily.  Psychiatric/Behavioral: Positive for decreased concentration and depression. Negative for confusion and suicidal ideas. The patient is nervous/anxious. The patient does not have insomnia.     There are no problems to display for this patient.   Allergies  Allergen Reactions  . Reglan [Metoclopramide] Other (See Comments)    Formication (felt like insects crawling all over her)    Past Surgical History:  Procedure Laterality Date  . APPENDECTOMY    . LASIK Bilateral     Social History   Tobacco Use  . Smoking status: Never Smoker  . Smokeless tobacco: Never Used  Substance Use Topics  . Alcohol use: Yes  . Drug use: No     Medication list has been reviewed and updated.  Current Meds  Medication Sig  . sertraline (ZOLOFT) 50 MG tablet Take 1.5 tablets (75 mg total) by mouth daily.    PHQ 2/9 Scores 09/07/2019 06/10/2019 03/09/2019 11/07/2018  PHQ - 2 Score 2 0 1 0  PHQ- 9 Score 3 2 2  0    BP Readings from Last 3 Encounters:  09/07/19 124/62  06/10/19 100/60  03/09/19 120/64    Physical Exam Vitals and nursing note reviewed.  Constitutional:      General: She is irritable. She is not in acute distress.    Appearance: She is not diaphoretic.  HENT:     Head:  Normocephalic and atraumatic.     Right Ear: Tympanic membrane, ear canal and external ear normal.     Left Ear: Tympanic membrane, ear canal and external ear normal.     Nose: Nose normal.  Eyes:     General:        Right eye: No discharge.        Left eye: No  discharge.     Conjunctiva/sclera: Conjunctivae normal.     Pupils: Pupils are equal, round, and reactive to light.  Neck:     Thyroid: No thyromegaly.     Vascular: No JVD.  Cardiovascular:     Rate and Rhythm: Normal rate and regular rhythm.     Heart sounds: Normal heart sounds. No murmur. No friction rub. No gallop.   Pulmonary:     Effort: Pulmonary effort is normal.     Breath sounds: Normal breath sounds.  Abdominal:     General: Bowel sounds are normal.     Palpations: Abdomen is soft. There is no mass.     Tenderness: There is no abdominal tenderness. There is no guarding.  Musculoskeletal:        General: Normal range of motion.     Cervical back: Normal range of motion and neck supple.  Lymphadenopathy:     Cervical: No cervical adenopathy.  Skin:    General: Skin is warm and dry.  Neurological:     Mental Status: She is alert.     Deep Tendon Reflexes: Reflexes are normal and symmetric.     Wt Readings from Last 3 Encounters:  09/07/19 102 lb (46.3 kg)  06/10/19 106 lb (48.1 kg)  03/09/19 106 lb (48.1 kg)    BP 124/62   Pulse 80   Ht 5\' 2"  (1.575 m)   Wt 102 lb (46.3 kg)   BMI 18.66 kg/m   Assessment and Plan: 1. Anxiety Chronic.  Relatively controlled.  Compared to previous treated level.  Gad scores noted to be 9.  PHQ was 3 and depression has never been a primary concern as much as anxiety and OCD.  At this point in time there is moderate anxiety patient has a new job which has stability in terms of longevity and Salary/benefits.  She is in a partial remission and we will therefore increase her Zoloft to 100 mg a day with a recheck in 90 days.  2. History of OCD (obsessive compulsive disorder) Patient has some OCD issues which is gradually improving on her sertraline.  We will continue to prescribe sertraline but at increased dosing of 100 mg but she is tolerating 75 mg without issues.

## 2019-11-25 ENCOUNTER — Ambulatory Visit: Payer: 59 | Admitting: Family Medicine

## 2019-11-25 ENCOUNTER — Ambulatory Visit: Payer: Self-pay | Admitting: Family Medicine

## 2020-01-28 ENCOUNTER — Other Ambulatory Visit: Payer: Self-pay | Admitting: Obstetrics and Gynecology

## 2020-01-28 DIAGNOSIS — F32A Depression, unspecified: Secondary | ICD-10-CM

## 2020-01-28 DIAGNOSIS — F419 Anxiety disorder, unspecified: Secondary | ICD-10-CM

## 2020-01-28 MED ORDER — SERTRALINE HCL 100 MG PO TABS: 100.0000 mg | ORAL_TABLET | Freq: Every day | ORAL | 4 refills | Status: DC

## 2020-02-14 ENCOUNTER — Telehealth: Payer: 59 | Admitting: Nurse Practitioner

## 2020-02-14 DIAGNOSIS — M948X9 Other specified disorders of cartilage, unspecified sites: Secondary | ICD-10-CM

## 2020-02-14 MED ORDER — CIPROFLOXACIN HCL 500 MG PO TABS: 500.0000 mg | ORAL_TABLET | Freq: Two times a day (BID) | ORAL | 0 refills | Status: AC

## 2020-02-14 NOTE — Progress Notes (Signed)
E Visit for Cellulitis  We are sorry that you are not feeling well. Here is how we plan to help!  Based on what you shared with me it looks like you have cellulitis.  Cellulitis looks like areas of skin redness, swelling, and warmth; it develops as a result of bacteria entering under the skin. Little red spots and/or bleeding can be seen in skin, and tiny surface sacs containing fluid can occur. Fever can be present. Cellulitis is almost always on one side of a body, and the lower limbs are the most common site of involvement.   I have prescribed:  Ciprofloxacin 500mg  twice daily for 7 days.  HOME CARE:  . Take your medications as ordered and take all of them, even if the skin irritation appears to be healing.   GET HELP RIGHT AWAY IF:  . Symptoms that don't begin to go away within 48 hours. . Severe redness persists or worsens . If the area turns color, spreads or swells. . If it blisters and opens, develops yellow-brown crust or bleeds. . You develop a fever or chills. . If the pain increases or becomes unbearable.  . Are unable to keep fluids and food down.  MAKE SURE YOU    Understand these instructions.  Will watch your condition.  Will get help right away if you are not doing well or get worse.  Thank you for choosing an e-visit. Your e-visit answers were reviewed by a board certified advanced clinical practitioner to complete your personal care plan. Depending upon the condition, your plan could have included both over the counter or prescription medications. Please review your pharmacy choice. Make sure the pharmacy is open so you can pick up prescription now. If there is a problem, you may contact your provider through and have the prescription routed to another pharmacy. Your safety is important to [employer]. If you have drug allergies check your prescription carefully.  For the next 24 hours you can use MyChart to ask questions about today's visit, request a  non-urgent call back, or ask for a work or school excuse. You will get an email in the next two days asking about your experience. I hope that your e-visit has been valuable and will speed your recovery.  I have spent at least 5 minutes reviewing and documenting in the patient's chart.

## 2020-08-08 IMAGING — US US BREAST*R* LIMITED INC AXILLA
1 series · 2 of 2 positions shown · non-contrast
Comparison: None.

CLINICAL DATA: Patient complains of a palpable right breast
abnormality.

EXAM:
DIGITAL DIAGNOSTIC BILATERAL MAMMOGRAM WITH CAD AND TOMO
ULTRASOUND RIGHT BREAST

[Series 1: us breast*right* limited inc axilla · 0.07mm/px · 2 of 2 slices shown]
[im 1/2]
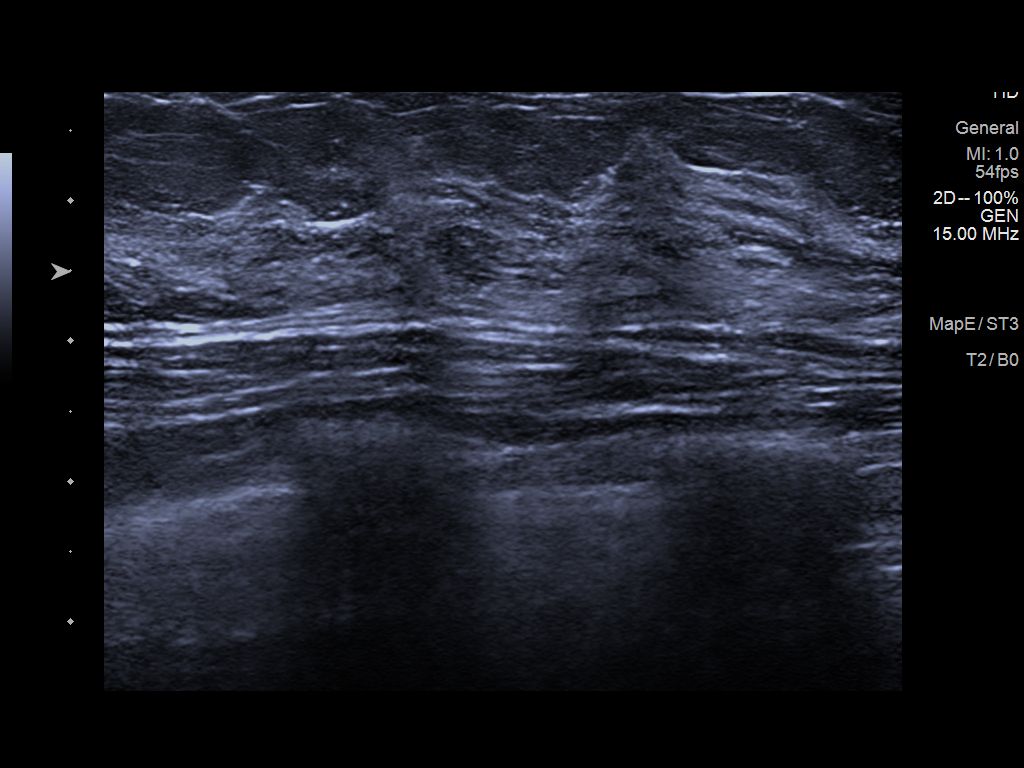
[im 2/2]
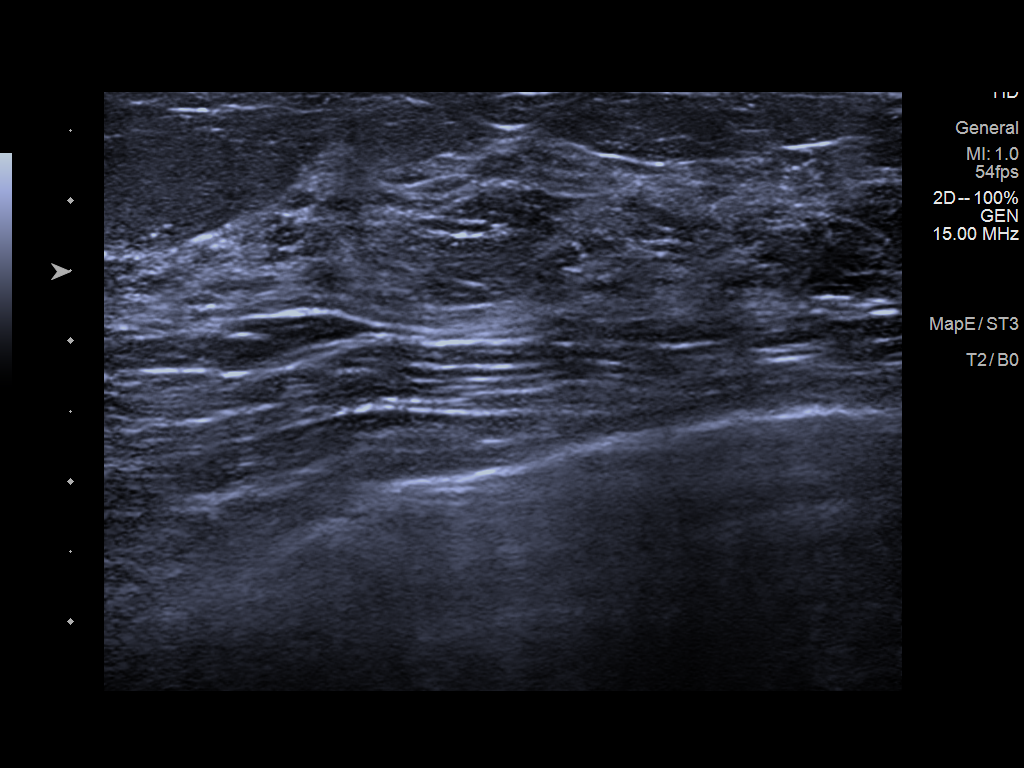

[2 of 2 positions shown; findings below may reference images not displayed]

ACR Breast Density Category d: The breast tissue is extremely dense,
which lowers the sensitivity of mammography.
FINDINGS: No suspicious mass, malignant type microcalcifications or distortion
detected in either breast. Spot tangential view of the of clinical
concern in the right breast shows normal fibroglandular tissue.

Mammographic images were processed with CAD.

On physical exam, I palpate mild thickening in the right breast at
12 o'clock 3 cm from the nipple.

Targeted ultrasound is performed, showing normal tissue in the area
of clinical concern in the right breast at 12 o'clock 3 cm from the
nipple. No solid or cystic mass, abnormal shadowing or distortion
visualized.
IMPRESSION: No evidence of malignancy in either breast.

RECOMMENDATION:
If the clinical exam remains benign/stable screening mammography can
be deferred until the age of 40. The importance of self-breast
examination was discussed with the patient.

I have discussed the findings and recommendations with the patient.
If applicable, a reminder letter will be sent to the patient
regarding the next appointment.

BI-RADS CATEGORY  1: Negative.

## 2021-04-07 ENCOUNTER — Encounter: Payer: Self-pay | Admitting: Obstetrics and Gynecology

## 2021-04-07 ENCOUNTER — Other Ambulatory Visit: Payer: Self-pay | Admitting: Obstetrics and Gynecology

## 2021-04-07 DIAGNOSIS — F419 Anxiety disorder, unspecified: Secondary | ICD-10-CM

## 2021-04-07 MED ORDER — SERTRALINE HCL 100 MG PO TABS: 100.0000 mg | ORAL_TABLET | Freq: Every day | ORAL | 4 refills | Status: DC

## 2021-04-07 NOTE — Telephone Encounter (Signed)
Refill sent.

## 2021-04-07 NOTE — Progress Notes (Signed)
Refill for sertaline sent at patient request

## 2021-04-19 ENCOUNTER — Other Ambulatory Visit (HOSPITAL_COMMUNITY)
Admission: RE | Admit: 2021-04-19 | Discharge: 2021-04-19 | Disposition: A | Discharge status: Home / Self Care | Payer: Managed Care, Other (non HMO) | Source: Ambulatory Visit | Attending: Obstetrics and Gynecology | Admitting: Obstetrics and Gynecology

## 2021-04-19 ENCOUNTER — Encounter: Payer: Self-pay | Admitting: Obstetrics and Gynecology

## 2021-04-19 ENCOUNTER — Other Ambulatory Visit: Payer: Self-pay

## 2021-04-19 ENCOUNTER — Ambulatory Visit (INDEPENDENT_AMBULATORY_CARE_PROVIDER_SITE_OTHER): Payer: Managed Care, Other (non HMO) | Admitting: Obstetrics and Gynecology

## 2021-04-19 VITALS — BP 112/70 | Ht 63.0 in | Wt 114.4 lb

## 2021-04-19 DIAGNOSIS — Z124 Encounter for screening for malignant neoplasm of cervix: Secondary | ICD-10-CM | POA: Insufficient documentation

## 2021-04-19 DIAGNOSIS — F341 Dysthymic disorder: Secondary | ICD-10-CM

## 2021-04-19 DIAGNOSIS — Z30016 Encounter for initial prescription of transdermal patch hormonal contraceptive device: Secondary | ICD-10-CM

## 2021-04-19 DIAGNOSIS — G2581 Restless legs syndrome: Secondary | ICD-10-CM

## 2021-04-19 DIAGNOSIS — Z01419 Encounter for gynecological examination (general) (routine) without abnormal findings: Secondary | ICD-10-CM | POA: Diagnosis not present

## 2021-04-19 DIAGNOSIS — D509 Iron deficiency anemia, unspecified: Secondary | ICD-10-CM | POA: Diagnosis not present

## 2021-04-19 DIAGNOSIS — Z789 Other specified health status: Secondary | ICD-10-CM

## 2021-04-19 LAB — HM PAP SMEAR: HM Pap smear: NORMAL

## 2021-04-19 MED ORDER — XULANE 150-35 MCG/24HR TD PTWK: 1.0000 | MEDICATED_PATCH | TRANSDERMAL | 12 refills | Status: DC

## 2021-04-19 NOTE — Progress Notes (Incomplete)
Gynecology Annual Exam  PCP: Duanne Limerick, MD  Chief Complaint:  Chief Complaint  Patient presents with   Gynecologic Exam    History of Present Illness: Patient is a 36 y.o. G0P0000 presents for annual exam. The patient has no complaints today.   LMP: Patient's last menstrual period was 04/19/2021. Average Interval: *** Duration of flow: {numbers; 0-10:33138} days Heavy Menses: {yes/no:63} Dysmenorrhea: {yes/no:63}  She {Actions; denies-reports:120008} passage of large clots She {Actions; denies-reports:120008} sensations of gushing or flooding of blood. She {Actions; denies-reports:120008} accidents where she bleeds through her clothing. She {Actions; denies-reports:120008} that she changes a saturated pad or tampon more frequently than every hour.  She {Actions; denies-reports:120008} that pain from her periods limits her activities.  The patient {DOES_DOES PPJ:09326} perform self breast exams.  There {is/is no:19420} notable family history of breast or ovarian cancer in her family.  The patient reports her exercise generally consists of *** .  The patient {Blank single:19197::"reports","denies"} current symptoms of depression.   PHQ-9: *** GAD-7: ***   Review of Systems: ROS  Past Medical History:  Past Medical History:  Diagnosis Date   Hx of abdominal surgery    malrotated abdomen    Past Surgical History:  Past Surgical History:  Procedure Laterality Date   APPENDECTOMY     LASIK Bilateral     Gynecologic History:  Patient's last menstrual period was 04/19/2021. Menarche: ***  History of fibroids, polyps, or ovarian cysts? : ***  History of PCOS? *** Hstory of Endometriosis? *** History of abnormal pap smears? *** Have you had any sexually transmitted infections in the past? ***  She *** HPV vaccination in the past. ***  Last Pap: Results were: *** {Findings; lab pap smear results:16707::"NIL and HR HPV+","NIL and HR HPV negative"}     She identifies as a ***. She is ***sexually active with ***.   She {has/denies:315300} dyspareunia. She {has/denies:315300} postcoital bleeding.  She currently uses {method:5051} for contraception.    Obstetric History: G0P0000  Family History:  Family History  Problem Relation Age of Onset   Diabetes Father    Hypertension Father    Heart disease Father    Hypertension Maternal Grandmother    Stroke Maternal Grandmother    Diabetes Maternal Grandfather    Hypertension Paternal Grandmother    Breast cancer Paternal Grandmother     Social History:  Social History   Socioeconomic History   Marital status: Single    Spouse name: Not on file   Number of children: Not on file   Years of education: Not on file   Highest education level: Not on file  Occupational History   Not on file  Tobacco Use   Smoking status: Never   Smokeless tobacco: Never  Vaping Use   Vaping Use: Never used  Substance and Sexual Activity   Alcohol use: Yes   Drug use: No   Sexual activity: Not Currently    Birth control/protection: Implant  Other Topics Concern   Not on file  Social History Narrative   Not on file   Social Determinants of Health   Financial Resource Strain: Not on file  Food Insecurity: Not on file  Transportation Needs: Not on file  Physical Activity: Not on file  Stress: Not on file  Social Connections: Not on file  Intimate Partner Violence: Not on file    Allergies:  Allergies  Allergen Reactions   Reglan [Metoclopramide] Other (See Comments)    Formication (felt like insects crawling  all over her)    Medications: Prior to Admission medications   Medication Sig Start Date End Date Taking? Authorizing Provider  sertraline (ZOLOFT) 100 MG tablet Take 1 tablet (100 mg total) by mouth daily. 04/07/21  Yes Runette Scifres, Jaquelyn Bitter, MD    Physical Exam Vitals: Blood pressure 112/70, height 5\' 3"  (1.6 m), weight 114 lb 6.4 oz (51.9 kg),  last menstrual period 04/19/2021.  OBGyn Exam   Female chaperone present for pelvic and breast  portions of the physical exam  Assessment: 36 y.o. G0P0000 routine annual exam  Plan: Problem List Items Addressed This Visit   None Visit Diagnoses     Encounter for annual routine gynecological examination    -  Primary   Cervical cancer screening       Relevant Orders   Cytology - PAP       1) STI screening was offered and {Blank single:19197::"accepted","declined"}  2) ASCCP guidelines and rational discussed.  Patient opts for {Blank single:19197::"every 5 years","every 3 years","yearly","Discontinue"} screening interval  3) Contraception - ***  4) Routine healthcare maintenance including cholesterol, diabetes screening discussed {Blank single:19197::"managed by PCP","Ordered today","To return fasting at a later date","Declines"}  5) ***  32 MD, Adelene Idler [facility], [facility] 04/19/2021 1:52 PM

## 2021-04-19 NOTE — Progress Notes (Signed)
Gynecology Annual Exam  PCP: Duanne Limerick, MD  Chief Complaint:  Chief Complaint  Patient presents with   Gynecologic Exam    History of Present Illness: Patient is a 36 y.o. G0P0000 presents for annual exam. The patient has no complaints today.   LMP: Patient's last menstrual period was 04/19/2021. Average Interval: 4-6 weeks Duration of flow: 10 days Heavy Menses: no Dysmenorrhea: no  She denies passage of large clots She reports sensations of gushing or flooding of blood. She reports accidents where she bleeds through her clothing. She denies that she changes a saturated pad or tampon more frequently than every hour.  She denies that pain from her periods limits her activities.  The patient does perform self breast exams.  There is notable family history of breast or ovarian cancer in her family.  The patient reports her exercise generally consists of cardio and weights 5 days a week .  She has been experiencing symptoms of restless leg.   She has been using the Nexplanon but is unsatisfied with this bleeding pattern.  She used Depo-Provera in the past.  She prefers amenorrhea.  She would like to transition to continuous birth control.  The patient reports current symptoms of depression.  She reports that she has switched jobs and is happy and has significantly less work stress.  She is working from home.  She has experienced some manic episodes over the last year where she feels that her entire body is sugary and that she has significant energy.  Sometimes she has social stress/anxiety that prevents her from going out as she normally would. She prefers a holistic approach to her mental health care.  She takes steps to exercise and do the things for her mental health.  PHQ-9: 10   GAD-7: 11     Review of Systems: Review of Systems  Constitutional:  Negative for chills, fever, malaise/fatigue and weight loss.  HENT:  Negative for congestion, hearing loss and sinus  pain.   Eyes:  Negative for blurred vision and double vision.  Respiratory:  Negative for cough, sputum production, shortness of breath and wheezing.   Cardiovascular:  Negative for chest pain, palpitations, orthopnea and leg swelling.  Gastrointestinal:  Negative for abdominal pain, constipation, diarrhea, nausea and vomiting.  Genitourinary:  Negative for dysuria, flank pain, frequency, hematuria and urgency.  Musculoskeletal:  Negative for back pain, falls and joint pain.  Skin:  Negative for itching and rash.  Neurological:  Negative for dizziness and headaches.  Psychiatric/Behavioral:  Negative for depression, substance abuse and suicidal ideas. The patient is not nervous/anxious.    Past Medical History:  Past Medical History:  Diagnosis Date   Hx of abdominal surgery    malrotated abdomen    Past Surgical History:  Past Surgical History:  Procedure Laterality Date   APPENDECTOMY     LASIK Bilateral     Gynecologic History:  Patient's last menstrual period was 04/19/2021. Menarche: 15  History of fibroids, polyps, or ovarian cysts? : no  History of PCOS? no Hstory of Endometriosis? no History of abnormal pap smears? yes Have you had any sexually transmitted infections in the past? no  She reports HPV vaccination in the past.   Last Pap: Results were: 05/08/2017 NIL HV negative   She identifies as a female. She is sexually active with men.   She denies dyspareunia. She denies postcoital bleeding.  She currently uses Nexplanon for contraception.    Obstetric History: G0P0000  Family  History:  Family History  Problem Relation Age of Onset   Diabetes Father    Hypertension Father    Heart disease Father    Hypertension Maternal Grandmother    Stroke Maternal Grandmother    Diabetes Maternal Grandfather    Hypertension Paternal Grandmother    Breast cancer Paternal Grandmother     Social History:  Social History   Socioeconomic History   Marital status:  Single    Spouse name: Not on file   Number of children: Not on file   Years of education: Not on file   Highest education level: Not on file  Occupational History   Not on file  Tobacco Use   Smoking status: Never   Smokeless tobacco: Never  Vaping Use   Vaping Use: Never used  Substance and Sexual Activity   Alcohol use: Yes   Drug use: No   Sexual activity: Not Currently    Birth control/protection: Implant  Other Topics Concern   Not on file  Social History Narrative   Not on file   Social Determinants of Health   Financial Resource Strain: Not on file  Food Insecurity: Not on file  Transportation Needs: Not on file  Physical Activity: Not on file  Stress: Not on file  Social Connections: Not on file  Intimate Partner Violence: Not on file    Allergies:  Allergies  Allergen Reactions   Reglan [Metoclopramide] Other (See Comments)    Formication (felt like insects crawling all over her)    Medications: Prior to Admission medications   Medication Sig Start Date End Date Taking? Authorizing Provider  norelgestromin-ethinyl estradiol Marilu Favre) 150-35 MCG/24HR transdermal patch Place 1 patch onto the skin once a week. Continuous weekly patch for medical amenorrhea 04/19/21  Yes Jaylan Hinojosa R, MD  sertraline (ZOLOFT) 100 MG tablet Take 1 tablet (100 mg total) by mouth daily. 04/07/21  Yes Calvert Charland, Stefanie Libel, MD    Physical Exam Vitals: Blood pressure 112/70, height 5\' 3"  (1.6 m), weight 114 lb 6.4 oz (51.9 kg), last menstrual period 04/19/2021.  Physical Exam Constitutional:      Appearance: She is well-developed.  Genitourinary:     Genitourinary Comments: External: Normal appearing vulva. No lesions noted.  Speculum examination: Normal appearing cervix. Dark red blood in the vaginal vault. No discharge.   Bimanual examination: Uterus midline, non-tender, normal in size, shape and contour.  No CMT. No adnexal masses. No adnexal tenderness. Pelvis not  fixed.  Breast Exam: breast equal without skin changes, nipple discharge, breast lump or enlarged lymph nodes   HENT:     Head: Normocephalic and atraumatic.  Neck:     Thyroid: No thyromegaly.  Cardiovascular:     Rate and Rhythm: Normal rate and regular rhythm.     Heart sounds: Normal heart sounds.  Pulmonary:     Effort: Pulmonary effort is normal.     Breath sounds: Normal breath sounds.  Abdominal:     General: Bowel sounds are normal. There is no distension.     Palpations: Abdomen is soft. There is no mass.  Musculoskeletal:     Cervical back: Neck supple.  Neurological:     Mental Status: She is alert and oriented to person, place, and time.  Skin:    General: Skin is warm and dry.  Psychiatric:        Behavior: Behavior normal.        Thought Content: Thought content normal.  Judgment: Judgment normal.  Vitals reviewed.   Nexplanon Removal and Insertion Procedure Note Removal: Appropriate time out taken. Patient placed in dorsal supine with left arm above head, elbow flexed at 90 degrees, arm resting on examination table.  The Nexplanon was noted in the patient's arm and the end was identified. The skin was cleansed with a Betadine solution. A small injection of subcutaneous lidocaine was given over the end of the implant. An incision was made at the end of the implant. The rod was noted in the incision and grasped with a hemostat. It was noted to be intact.   The removal site covered dressed with steri-strips and a band aid before applying  a kerlex bandage pressure dressing. Minimal blood loss was noted during the procedure.  The patient tolerated the procedure well.   Female chaperone present for pelvic and breast  portions of the physical exam  Assessment: 36 y.o. G0P0000 routine annual exam  Plan: Problem List Items Addressed This Visit   None Visit Diagnoses     Encounter for annual routine gynecological examination    -  Primary   Cervical cancer  screening       Relevant Orders   Cytology - PAP   Encounter for initial prescription of transdermal patch hormonal contraceptive device       Relevant Medications   norelgestromin-ethinyl estradiol Marilu Favre) 150-35 MCG/24HR transdermal patch       1) STI screening was offered and declined.  2) Pap smear performed today.  3) Contraception - desires to swab from Nexplanon to continuous birth control. Will trial patches. Nexplanon removed today.  4) Routine healthcare maintenance including cholesterol, diabetes screening discussed Ordered today  5) Restless leg-discussed possibility of this being related to anemia.  We will check hemoglobin and CBC today.  Patient reports that she does not tolerate oral iron has a history of constipation.  For this reason we will refer to hematology.  Adrian Prows MD, [facility] P[facility], [facility] 04/19/2021 2:33 PM

## 2021-04-19 NOTE — Patient Instructions (Addendum)
[facility] [place] for Calcium and Vitamin D  Age (yr) Calcium Recommended Dietary Allowance (mg/day) Vitamin D Recommended Dietary Allowance (international units/day)  9-18 1,300 600  19-50 1,000 600  51-70 1,200 600  71 and older 1,200 800  Data from Institute of Medicine. Dietary reference intakes: calcium, vitamin D. [place], [place]: [employer]; 2011.    Kathleen Putnam, DO  [phone]  [facility] [address], [place] [address]   Lavon Paganini, MD [id number]  [facility] [address] [place], [place] [address]     Exercising to Stay Healthy To become healthy and stay healthy, it is recommended that you do moderate-intensity and vigorous-intensity exercise. You can tell that you are exercising at a moderate intensity if your heart starts beating faster and you start breathing faster but can still hold a conversation. You can tell that you are exercising at a vigorous intensity if you are breathing much harder and faster and cannot hold a conversation while exercising. How can exercise benefit me? Exercising regularly is important. It has many health benefits, such as: Improving overall fitness, flexibility, and endurance. Increasing bone density. Helping with weight control. Decreasing body fat. Increasing muscle strength and endurance. Reducing stress and tension, anxiety, depression, or anger. Improving overall health. What guidelines should I follow while exercising? Before you start a new exercise program, talk with your health care provider. Do not exercise so much that you hurt yourself, feel dizzy, or get very short of breath. Wear comfortable clothes and wear shoes with good support. Drink plenty of water while you exercise to prevent dehydration or heat stroke. Work out until your breathing and your heartbeat get faster (moderate intensity). How  often should I exercise? Choose an activity that you enjoy, and set realistic goals. Your health care provider can help you make an activity plan that is individually designed and works best for you. Exercise regularly as told by your health care provider. This may include: Doing strength training two times a week, such as: Lifting weights. Using resistance bands. Push-ups. Sit-ups. Yoga. Doing a certain intensity of exercise for a given amount of time. Choose from these options: A total of 150 minutes of moderate-intensity exercise every week. A total of 75 minutes of vigorous-intensity exercise every week. A mix of moderate-intensity and vigorous-intensity exercise every week. Children, pregnant women, people who have not exercised regularly, people who are overweight, and older adults may need to talk with a health care provider about what activities are safe to perform. If you have a medical condition, be sure to talk with your health care provider before you start a new exercise program. What are some exercise ideas? Moderate-intensity exercise ideas include: Walking 1 mile (1.6 km) in about 15 minutes. Biking. Hiking. Golfing. Dancing. Water aerobics. Vigorous-intensity exercise ideas include: Walking 4.5 miles (7.2 km) or more in about 1 hour. Jogging or running 5 miles (8 km) in about 1 hour. Biking 10 miles (16.1 km) or more in about 1 hour. Lap swimming. Roller-skating or in-line skating. Cross-country skiing. Vigorous competitive sports, such as football, basketball, and soccer. Jumping rope. Aerobic dancing. What are some everyday activities that can help me get exercise? Yard work, such as: Psychologist, educational. Raking and bagging leaves. Washing your car. Pushing a stroller. Shoveling snow. Gardening. Washing windows or floors. How can I be more active in my day-to-day activities? Use stairs instead of an elevator. Take a walk during your  lunch break. If you  drive, park your car farther away from your work or school. If you take public transportation, get off one stop early and walk the rest of the way. Stand up or walk around during all of your indoor phone calls. Get up, stretch, and walk around every 30 minutes throughout the day. Enjoy exercise with a friend. Support to continue exercising will help you keep a regular routine of activity. Where to find more information You can find more information about exercising to stay healthy from: U.S. Department of Health and Human Services: BondedCompany.at Centers for Disease Control and Prevention (CDC): http://www.wolf.info/ Summary Exercising regularly is important. It will improve your overall fitness, flexibility, and endurance. Regular exercise will also improve your overall health. It can help you control your weight, reduce stress, and improve your bone density. Do not exercise so much that you hurt yourself, feel dizzy, or get very short of breath. Before you start a new exercise program, talk with your health care provider. This information is not intended to replace advice given to you by your health care provider. Make sure you discuss any questions you have with your health care provider. Document Revised: 07/29/2020 Document Reviewed: 07/29/2020 Elsevier Patient Education  2022 [place] Eating There are many ways to save money at the grocery store and continue to eat healthy. You can be successful if you: Plan meals according to your budget. Make a grocery list and only purchase food according to your grocery list. Prepare food yourself at home. What are tips for following this plan? Reading food labels Compare food labels between brand name foods and the store brand. Often the nutritional value is the same, but the store brand is lower cost. Look for products that do not have added sugar, fat, or salt (sodium). These often cost the same but are healthier for you.  Products may be labeled as: Sugar-free. Nonfat. Low-fat. Sodium-free. Low-sodium. Look for lean ground beef labeled as at least 92% lean and 8% fat. Shopping  Buy only the items on your grocery list and go only to the areas of the store that have the items on your list. Use coupons only for foods and brands you normally buy. Avoid buying items you wouldn't normally buy simply because they are on sale. Check online and in newspapers for weekly deals. Buy healthy items from the bulk bins when available, such as herbs, spices, flour, pasta, nuts, and dried fruit. Buy fruits and vegetables that are in season. Prices are usually lower on in-season produce. Look at the unit price on the price tag. Use it to compare different brands and sizes to find out which item is the best deal. Choose healthy items that are often low-cost, such as carrots, potatoes, apples, bananas, and oranges. Dried or canned beans are a low-cost protein source. Buy in bulk and freeze extra food. Items you can buy in bulk include meats, fish, poultry, frozen fruits, and frozen vegetables. Avoid buying "ready-to-eat" foods, such as pre-cut fruits and vegetables and pre-made salads. If possible, shop around to discover where you can find the best prices. Consider other retailers such as dollar stores, larger [employer], local fruit and vegetable stands, and farmers markets. Do not shop when you are hungry. If you shop while hungry, it may be hard to stick to your list and budget. Resist impulse buying. Use your grocery list as your official plan for the week. Buy a variety of vegetables and fruits by purchasing  fresh, frozen, and canned items. Look at the top and bottom shelves for deals. Foods at eye level (eye level of an adult or child) are usually more expensive. Be efficient with your time when shopping. The more time you spend at the store, the more money you are likely to spend. To save money when choosing more  expensive foods like meats and dairy: Choose cheaper cuts of meat, such as bone-in chicken thighs and drumsticks instead of skinless and boneless chicken. When you are ready to prepare the chicken, you can remove the skin yourself to make it healthier. Choose lean meats like chicken or [place] instead of beef. Choose canned seafood, such as tuna, salmon, or sardines. Buy eggs as a low-cost source of protein. Buy dried beans and peas, such as lentils, split peas, or kidney beans instead of meats. Dried beans and peas are a good alternative source of protein. Buy the larger tubs of yogurt instead of individual-sized containers. Choose water instead of sodas and other sweetened beverages. Avoid buying chips, cookies, and other "junk food." These items are usually expensive and not healthy. Cooking Make extra food and freeze the extras in meal-sized containers or in individual portions for fast meals and snacks. Pre-cook on days when you have extra time to prepare meals in advance. You can keep these meals in the fridge or freezer and reheat for a quick meal. When you come home from the grocery store, wash, peel, and cut fruits and vegetables so they are ready to use and eat. This will help reduce food waste. Meal planning Do not eat out or get fast food. Prepare food at home. Make a grocery list and make sure to bring it with you to the store. If you have a smart phone, you could use your phone to create your shopping list. Plan meals and snacks according to a grocery list and budget you create. Use leftovers in your meal plan for the week. Look for recipes where you can cook once and make enough food for two meals. Prepare budget-friendly types of meals like stews, casseroles, and stir-fry dishes. Try some meatless meals or try "no cook" meals like salads. Make sure that half your plate is filled with fruits or vegetables. Choose from fresh, frozen, or canned fruits and vegetables. If eating  canned, remember to rinse them before eating. This will remove any excess salt added for packaging. Summary Eating healthy on a budget is possible if you plan your meals according to your budget, purchase according to your budget and grocery list, and prepare food yourself. Tips for buying more food on a limited budget include buying generic brands, using coupons only for foods you normally buy, and buying healthy items from the bulk bins when available. Tips for buying cheaper food to replace expensive food include choosing cheaper, lean cuts of meat, and buying dried beans and peas. This information is not intended to replace advice given to you by your health care provider. Make sure you discuss any questions you have with your health care provider. Document Revised: 01/14/2020 Document Reviewed: 01/14/2020 Elsevier Patient Education  [place] protect organs, store calcium, anchor muscles, and support the whole body. Keeping your bones strong is important, especially as you get older. You can take actions to help keep your bones strong and healthy. Why is keeping my bones healthy important? Keeping your bones healthy is important because your body constantly replaces bone cells. Cells get old, and new cells take  their place. As we age, we lose bone cells because the body may not be able to make enough new cells to replace the old cells. The amount of bone cells and bone tissue you have is referred to as bone mass. The higher your bone mass, the stronger your bones. The aging process leads to an overall loss of bone mass in the body, which can increase the likelihood of: Broken bones. A condition in which the bones become weak and brittle (osteoporosis). A large decline in bone mass occurs in older adults. In women, it occurs about the time of menopause. What actions can I take to keep my bones healthy? Good health habits are important for maintaining healthy bones.  This includes eating nutritious foods and exercising regularly. To have healthy bones, you need to get enough of the right minerals and vitamins. Most nutrition experts recommend getting these nutrients from the foods that you eat. In some cases, taking supplements may also be recommended. Doing certain types of exercise is also important for bone health. What are the nutritional recommendations for healthy bones? Eating a well-balanced diet with plenty of calcium and vitamin D will help to protect your bones. Nutritional recommendations vary from person to person. Ask your health care provider what is healthy for you. Here are some general guidelines. Get enough calcium Calcium is the most important (essential) mineral for bone health. Most people can get enough calcium from their diet, but supplements may be recommended for people who are at risk for osteoporosis. Good sources of calcium include: Dairy products, such as low-fat or nonfat milk, cheese, and yogurt. Dark green leafy vegetables, such as bok choy and broccoli. Foods that have calcium added to them (are fortified). Foods that may be fortified with calcium include orange juice, cereal, bread, soy beverages, and tofu products. Nuts, such as almonds. Follow these recommended amounts for daily calcium intake: Infants, 0-6 months: 200 mg. Infants, 6-12 months: 260 mg. Children, age 62-3: 700 mg. Children, age 23-8: 1,000 mg. Children, age 627-13: 1,300 mg. Teens, age 2-18: 1,300 mg. Adults, age 65-50: 1,000 mg. Adults, age 3-70: Men: 1,000 mg. Women: 1,200 mg. Adults, age 32 or older: 1,200 mg. Pregnant and breastfeeding females: Teens: 1,300 mg. Adults: 1,000 mg. Get enough vitamin D Vitamin D is the most essential vitamin for bone health. It helps the body absorb calcium. Sunlight stimulates the skin to make vitamin D, so be sure to get enough sunlight. If you live in a cold climate or you do not get outside often, your health care  provider may recommend that you take vitamin D supplements. Good sources of vitamin D in your diet include: Egg yolks. Saltwater fish. Milk and cereal fortified with vitamin D. Follow these recommended amounts for daily vitamin D intake: Infants, 0-12 months: 400 international units (IU). Children and teens, age 62-18: 34 international units. Adults, age 74 or younger: 71 international units. Adults, age 7 or older: 23-1,000 international units. Get other important nutrients Other nutrients that are important for bone health include: Phosphorus. This mineral is found in meat, poultry, dairy foods, nuts, and legumes. The recommended daily intake for adult men and adult women is 700 mg. Magnesium. This mineral is found in seeds, nuts, dark green vegetables, and legumes. The recommended daily intake for adult men is 400-420 mg. For adult women, it is 310-320 mg. Vitamin K. This vitamin is found in green leafy vegetables. The recommended daily intake is 120 mcg for adult men and 90 mcg  for adult women. What type of physical activity is best for building and maintaining healthy bones? Weight-bearing and strength-building activities are important for building and maintaining healthy bones. Weight-bearing activities cause muscles and bones to work against gravity. Strength-building activities increase the strength of the muscles that support bones. Weight-bearing and muscle-building activities include: Walking and hiking. Jogging and running. Dancing. Gym exercises. Lifting weights. Tennis and racquetball. Climbing stairs. Aerobics. Adults should get at least 30 minutes of moderate physical activity on most days. Children should get at least 60 minutes of moderate physical activity on most days. Ask your health care provider what type of exercise is best for you. How can I find out if my bone mass is low? Bone mass can be measured with an X-ray test called a bone mineral density (BMD) test.  This test is recommended for all women who are age 71 or older. It may also be recommended for: Men who are age 52 or older. People who are at risk for osteoporosis because of: Having a long-term disease that weakens bones, such as kidney disease or rheumatoid arthritis. Having menopause earlier than normal. Taking medicine that weakens bones, such as steroids, thyroid hormones, or hormone treatment for breast cancer or prostate cancer. Smoking. Drinking three or more alcoholic drinks a day. Being underweight. Sedentary lifestyle. If you find that you have a low bone mass, you may be able to prevent osteoporosis or further bone loss by changing your diet and lifestyle. Where can I find more information? Bone Health & Osteoporosis Foundation: https://carlson-fletcher.info/ [employer]: www.bones.http://www.myers.net/ International Osteoporosis Foundation: Investment banker, operational.iofbonehealth.org Summary The aging process leads to an overall loss of bone mass in the body, which can increase the likelihood of broken bones and osteoporosis. Eating a well-balanced diet with plenty of calcium and vitamin D will help to protect your bones. Weight-bearing and strength-building activities are also important for building and maintaining strong bones. Bone mass can be measured with an X-ray test called a bone mineral density (BMD) test. This information is not intended to replace advice given to you by your health care provider. Make sure you discuss any questions you have with your health care provider. Document Revised: 09/14/2020 Document Reviewed: 09/14/2020 Elsevier Patient Education  2022 Elsevier Inc. Managing Anxiety, Adult After being diagnosed with anxiety, you may be relieved to know why you have felt or behaved a certain way. You may also feel overwhelmed about the treatment ahead and what it will mean for your life. With care and support, you can manage this condition. How to manage lifestyle changes Managing  stress and anxiety Stress is your body's reaction to life changes and events, both good and bad. Most stress will last just a few hours, but stress can be ongoing and can lead to more than just stress. Although stress can play a major role in anxiety, it is not the same as anxiety. Stress is usually caused by something external, such as a deadline, test, or competition. Stress normally passes after the triggering event has ended.  Anxiety is caused by something internal, such as imagining a terrible outcome or worrying that something will go wrong that will devastate you. Anxiety often does not go away even after the triggering event is over, and it can become long-term (chronic) worry. It is important to understand the differences between stress and anxiety and to manage your stress effectively so that it does not lead to an anxious response. Talk with your health care provider or a counselor  to learn more about reducing anxiety and stress. He or she may suggest tension reduction techniques, such as: Music therapy. Spend time creating or listening to music that you enjoy and that inspires you. Mindfulness-based meditation. Practice being aware of your normal breaths while not trying to control your breathing. It can be done while sitting or walking. Centering prayer. This involves focusing on a word, phrase, or sacred image that means something to you and brings you peace. Deep breathing. To do this, expand your stomach and inhale slowly through your nose. Hold your breath for 3-5 seconds. Then exhale slowly, letting your stomach muscles relax. Self-talk. Learn to notice and identify thought patterns that lead to anxiety reactions and change those patterns to thoughts that feel peaceful. Muscle relaxation. Taking time to tense muscles and then relax them. Choose a tension reduction technique that fits your lifestyle and personality. These techniques take time and practice. Set aside 5-15 minutes a day to  do them. Therapists can offer counseling and training in these techniques. The training to help with anxiety may be covered by some insurance plans. Other things you can do to manage stress and anxiety include: Keeping a stress diary. This can help you learn what triggers your reaction and then learn ways to manage your response. Thinking about how you react to certain situations. You may not be able to control everything, but you can control your response. Making time for activities that help you relax and not feeling guilty about spending your time in this way. Doing visual imagery. This involves imagining or creating mental pictures to help you relax. Practicing yoga. Through yoga poses, you can lower tension and promote relaxation.  Medicines Medicines can help ease symptoms. Medicines for anxiety include: Antidepressant medicines. These are usually prescribed for long-term daily control. Anti-anxiety medicines. These may be added in severe cases, especially when panic attacks occur. Medicines will be prescribed by a health care provider. When used together, medicines, psychotherapy, and tension reduction techniques may be the most effective treatment. Relationships Relationships can play a big part in helping you recover. Try to spend more time connecting with trusted friends and family members. Consider going to couples counseling if you have a partner, taking family education classes, or going to family therapy. Therapy can help you and others better understand your condition. How to recognize changes in your anxiety Everyone responds differently to treatment for anxiety. Recovery from anxiety happens when symptoms decrease and stop interfering with your daily activities at home or work. This may mean that you will start to: Have better concentration and focus. Worry will interfere less in your daily thinking. Sleep better. Be less irritable. Have more energy. Have improved memory. It  is also important to recognize when your condition is getting worse. Contact your health care provider if your symptoms interfere with home or work and you feel like your condition is not improving. Follow these instructions at home: Activity Exercise. Adults should do the following: Exercise for at least 150 minutes each week. The exercise should increase your heart rate and make you sweat (moderate-intensity exercise). Strengthening exercises at least twice a week. Get the right amount and quality of sleep. Most adults need 7-9 hours of sleep each night. Lifestyle  Eat a healthy diet that includes plenty of vegetables, fruits, whole grains, low-fat dairy products, and lean protein. Do not eat a lot of foods that are high in fats, added sugars, or salt (sodium). Make choices that simplify your life. Do not  use any products that contain nicotine or tobacco. These products include cigarettes, chewing tobacco, and vaping devices, such as e-cigarettes. If you need help quitting, ask your health care provider. Avoid caffeine, alcohol, and certain over-the-counter cold medicines. These may make you feel worse. Ask your pharmacist which medicines to avoid. General instructions Take over-the-counter and prescription medicines only as told by your health care provider. Keep all follow-up visits. This is important. Where to find support You can get help and support from these sources: Self-help groups. Online and Enter[employer] Corporationcommunity organizations. A trusted spiritual leader. Couples counseling. Family education classes. Family therapy. Where to find more information You may find that joining a support group helps you deal with your anxiety. The following sources can help you locate counselors or support groups near you: Mental Health America: www.mentalhealthamerica.net Anxiety and Depression Association of [place] (ADAA): ProgramCam.dewww.adaa.org The First Americanational Alliance on Mental Illness (NAMI): www.nami.org Contact a  health care provider if: You have a hard time staying focused or finishing daily tasks. You spend many hours a day feeling worried about everyday life. You become exhausted by worry. You start to have headaches or frequently feel tense. You develop chronic nausea or diarrhea. Get help right away if: You have a racing heart and shortness of breath. You have thoughts of hurting yourself or others. If you ever feel like you may hurt yourself or others, or have thoughts about taking your own life, get help right away. Go to your nearest emergency department or: Call your local emergency services (911 in the [place] Call a suicide crisis helpline, such as the National Suicide Prevention Lifeline at (843)562-90851-503-068-6331 or 988 in the [place] This is open 24 hours a day in the [place] Text the Crisis Text Line at 424 131 [id number] (in the [place] Summary Taking steps to learn and use tension reduction techniques can help calm you and help prevent triggering an anxiety reaction. When used together, medicines, psychotherapy, and tension reduction techniques may be the most effective treatment. Family, friends, and partners can play a big part in supporting you. This information is not intended to replace advice given to you by your health care provider. Make sure you discuss any questions you have with your health care provider. Document Revised: 10/26/2020 Document Reviewed: 07/24/2020 Elsevier Patient Education  2022 [employer]

## 2021-04-20 LAB — CBC WITH DIFFERENTIAL
Basophils Absolute: 0.1 10*3/uL (ref 0.0–0.2)
Basos: 1 %
EOS (ABSOLUTE): 0.1 10*3/uL (ref 0.0–0.4)
Eos: 2 %
Hematocrit: 33.6 % — ABNORMAL LOW (ref 34.0–46.6)
Hemoglobin: 10.3 g/dL — ABNORMAL LOW (ref 11.1–15.9)
Immature Grans (Abs): 0 10*3/uL (ref 0.0–0.1)
Immature Granulocytes: 0 %
Lymphocytes Absolute: 2.5 10*3/uL (ref 0.7–3.1)
Lymphs: 45 %
MCH: 23.5 pg — ABNORMAL LOW (ref 26.6–33.0)
MCHC: 30.7 g/dL — ABNORMAL LOW (ref 31.5–35.7)
MCV: 77 fL — ABNORMAL LOW (ref 79–97)
Monocytes Absolute: 0.5 10*3/uL (ref 0.1–0.9)
Monocytes: 9 %
Neutrophils Absolute: 2.4 10*3/uL (ref 1.4–7.0)
Neutrophils: 43 %
RBC: 4.38 x10E6/uL (ref 3.77–5.28)
RDW: 14.5 % (ref 11.7–15.4)
WBC: 5.5 10*3/uL (ref 3.4–10.8)

## 2021-04-20 LAB — COMPREHENSIVE METABOLIC PANEL
ALT: 15 IU/L (ref 0–32)
AST: 19 IU/L (ref 0–40)
Albumin/Globulin Ratio: 2.3 — ABNORMAL HIGH (ref 1.2–2.2)
Albumin: 4.8 g/dL (ref 3.8–4.8)
Alkaline Phosphatase: 43 IU/L — ABNORMAL LOW (ref 44–121)
BUN/Creatinine Ratio: 11 (ref 9–23)
BUN: 8 mg/dL (ref 6–20)
Bilirubin Total: 0.4 mg/dL (ref 0.0–1.2)
CO2: 22 mmol/L (ref 20–29)
Calcium: 9.2 mg/dL (ref 8.7–10.2)
Chloride: 103 mmol/L (ref 96–106)
Creatinine, Ser: 0.71 mg/dL (ref 0.57–1.00)
Globulin, Total: 2.1 g/dL (ref 1.5–4.5)
Glucose: 81 mg/dL (ref 70–99)
Potassium: 4.5 mmol/L (ref 3.5–5.2)
Sodium: 138 mmol/L (ref 134–144)
Total Protein: 6.9 g/dL (ref 6.0–8.5)
eGFR: 115 mL/min/{1.73_m2} (ref >59)

## 2021-04-20 LAB — VITAMIN D 25 HYDROXY (VIT D DEFICIENCY, FRACTURES): Vit D, 25-Hydroxy: 31.6 ng/mL (ref 30.0–100.0)

## 2021-04-20 LAB — FERRITIN: Ferritin: 8 ng/mL — ABNORMAL LOW (ref 15–150)

## 2021-04-20 LAB — VITAMIN B12: Vitamin B-12: 517 pg/mL (ref 232–1245)

## 2021-04-24 LAB — CYTOLOGY - PAP
Comment: NEGATIVE
Diagnosis: NEGATIVE
High risk HPV: NEGATIVE

## 2021-04-26 ENCOUNTER — Inpatient Hospital Stay: Payer: Managed Care, Other (non HMO) | Attending: Oncology | Admitting: Oncology

## 2021-04-26 ENCOUNTER — Other Ambulatory Visit: Payer: Self-pay

## 2021-04-26 ENCOUNTER — Inpatient Hospital Stay: Payer: Managed Care, Other (non HMO)

## 2021-04-26 ENCOUNTER — Encounter: Payer: Self-pay | Admitting: Oncology

## 2021-04-26 VITALS — BP 121/71 | HR 67 | Temp 97.2°F | Resp 18 | Wt 117.0 lb

## 2021-04-26 DIAGNOSIS — D509 Iron deficiency anemia, unspecified: Secondary | ICD-10-CM | POA: Insufficient documentation

## 2021-04-26 DIAGNOSIS — D508 Other iron deficiency anemias: Secondary | ICD-10-CM | POA: Diagnosis present

## 2021-04-26 NOTE — Progress Notes (Signed)
Patient here to establish care for anemia 

## 2021-04-26 NOTE — Progress Notes (Signed)
Hematology/Oncology Consult note [phone] [phone]   Patient Care Team: Juline Patch, MD as PCP - General (Family Medicine)  REFERRING PROVIDER: Homero Fellers, * CHIEF COMPLAINTS/REASON FOR VISIT:  Evaluation of iron deficiency anemia  HISTORY OF PRESENTING ILLNESS:  Kathleen Small is a  36 y.o.  female with PMH listed below was seen in consultation at the request of Schuman, Christanna R, *   for evaluation of iron deficiency anemia.   Reviewed patient's recent labs  04/19/2021 labs revealed anemia with hemoglobin of 10.3, MCV 77.  Normal WBC.  Ferritin was decreased at 8. Patient has no other previous blood work in current epic system for comparison. Patient works in the [employer], medical.  She noticed" restless leg syndrome".  Denies fatigue, feels well at baseline. Patient donates blood on a regular basis.  She reports having normal fingerstick hemoglobin prior to each donations. Menstrual period was having currently on contraceptive patch. Patient has tried 1 dose of oral iron supplementation previously and iron supplementation upset her stomach. Patient is on Zoloft for OCD.  She has a history of abdominal surgery for malrotated abdomen. She is a vegetarian  Review of Systems  Constitutional:  Negative for appetite change, chills, fatigue and fever.  HENT:   Negative for hearing loss and voice change.   Eyes:  Negative for eye problems.  Respiratory:  Negative for chest tightness and cough.   Cardiovascular:  Negative for chest pain.  Gastrointestinal:  Negative for abdominal distention, abdominal pain and blood in stool.  Endocrine: Negative for hot flashes.  Genitourinary:  Negative for difficulty urinating and frequency.   Musculoskeletal:  Negative for arthralgias.  Skin:  Negative for itching and rash.  Neurological:  Negative for extremity weakness.  Hematological:  Negative for adenopathy.  Psychiatric/Behavioral:  Negative for  confusion.    MEDICAL HISTORY:  Past Medical History:  Diagnosis Date   Hx of abdominal surgery    malrotated abdomen   IDA (iron deficiency anemia)    OCD (obsessive compulsive disorder)     SURGICAL HISTORY: Past Surgical History:  Procedure Laterality Date   APPENDECTOMY     LASIK Bilateral     SOCIAL HISTORY: Social History   Socioeconomic History   Marital status: Single    Spouse name: Not on file   Number of children: Not on file   Years of education: Not on file   Highest education level: Not on file  Occupational History   Not on file  Tobacco Use   Smoking status: Never   Smokeless tobacco: Never  Vaping Use   Vaping Use: Never used  Substance and Sexual Activity   Alcohol use: Yes    Comment: social   Drug use: No   Sexual activity: Not Currently    Birth control/protection: Implant  Other Topics Concern   Not on file  Social History Narrative   Not on file   Social Determinants of Health   Financial Resource Strain: Not on file  Food Insecurity: Not on file  Transportation Needs: Not on file  Physical Activity: Not on file  Stress: Not on file  Social Connections: Not on file  Intimate Partner Violence: Not on file    FAMILY HISTORY: Family History  Problem Relation Age of Onset   Hypertension Mother    Hyperlipidemia Mother    Osteoporosis Mother    Hyperlipidemia Father    Diabetes Father    Hypertension Father    Heart disease Father  Obesity Father    Sleep apnea Father    Kidney disease Father    Other Father        has pacemaker   Breast cancer Maternal Grandmother    Hypertension Maternal Grandmother    Stroke Maternal Grandmother    Diabetes Maternal Grandfather    Hypertension Paternal Grandmother    Breast cancer Paternal Grandmother    Ovarian cancer Maternal Great-grandmother     ALLERGIES:  is allergic to reglan [metoclopramide].  MEDICATIONS:  Current Outpatient Medications  Medication Sig Dispense Refill    norelgestromin-ethinyl estradiol Marilu Favre) 150-35 MCG/24HR transdermal patch Place 1 patch onto the skin once a week. Continuous weekly patch for medical amenorrhea 4 patch 12   polyethylene glycol (MIRALAX / GLYCOLAX) 17 g packet Take 17 g by mouth daily.     sertraline (ZOLOFT) 100 MG tablet Take 1 tablet (100 mg total) by mouth daily. 90 tablet 4   No current facility-administered medications for this visit.     PHYSICAL EXAMINATION: ECOG PERFORMANCE STATUS: 0 - Asymptomatic Vitals:   04/26/21 1129  BP: 121/71  Pulse: 67  Resp: 18  Temp: (!) 97.2 F (36.2 C)   Filed Weights   04/26/21 1129  Weight: 117 lb (53.1 kg)    Physical Exam Constitutional:      General: She is not in acute distress. HENT:     Head: Normocephalic and atraumatic.  Eyes:     General: No scleral icterus. Cardiovascular:     Rate and Rhythm: Normal rate and regular rhythm.     Heart sounds: Normal heart sounds.  Pulmonary:     Effort: Pulmonary effort is normal. No respiratory distress.     Breath sounds: No wheezing.  Abdominal:     General: Bowel sounds are normal. There is no distension.     Palpations: Abdomen is soft.  Musculoskeletal:        General: No deformity. Normal range of motion.     Cervical back: Normal range of motion and neck supple.  Skin:    General: Skin is warm and dry.     Findings: No erythema or rash.  Neurological:     Mental Status: She is alert and oriented to person, place, and time. Mental status is at baseline.     Cranial Nerves: No cranial nerve deficit.     Coordination: Coordination normal.  Psychiatric:        Mood and Affect: Mood normal.          LABORATORY DATA:  I have reviewed the data as listed Lab Results  Component Value Date   WBC 5.5 04/19/2021   HGB 10.3 (L) 04/19/2021   HCT 33.6 (L) 04/19/2021   MCV 77 (L) 04/19/2021   Recent Labs    04/19/21 1447  NA 138  K 4.5  CL 103  CO2 22  GLUCOSE 81  BUN 8  CREATININE 0.71   CALCIUM 9.2  PROT 6.9  ALBUMIN 4.8  AST 19  ALT 15  ALKPHOS 43*  BILITOT 0.4   Iron/TIBC/Ferritin/ %Sat    Component Value Date/Time   FERRITIN 8 (L) 04/19/2021 1447     RADIOGRAPHIC STUDIES: I have personally reviewed the radiological images as listed and agreed with the findings in the report. No results found.     ASSESSMENT & PLAN:  1. Other iron deficiency anemia     Labs are reviewed and discussed with patient. Consistent with iron deficiency anemia. Patient previously did not tolerate oral  iron supplementation. Recommend patient to stop blood donation. We discussed about the option of IV iron treatments. IV Venofer infusion side effects includes but not limited to allergy reactions/infusion reaction including anaphylactic reaction, headache, wheezing, chest tightness, high blood pressure, skin rash, weight gain, leg swelling, etc.  Patient opted to try diet modification with increase iron rich food. Given that she will stop blood donation and hopefully menorrhagia could be controlled with contraceptive patch, I think is reasonable for her to try diet modification, also I recommend patient to try multivitamin including iron. Repeat blood work in 3 months to see if any improvement.  If no improvement, she can try IV Venofer treatment at that time.  Orders Placed This Encounter  Procedures   CBC with Differential/Platelet    Standing Status:   Future    Standing Expiration Date:   04/26/2022   Ferritin    Standing Status:   Future    Standing Expiration Date:   04/26/2022   Iron and TIBC    Standing Status:   Future    Standing Expiration Date:   04/26/2022    All questions were answered. The patient knows to call the clinic with any problems questions or concerns.  Cc Schuman, Christanna R, *  Thank you for this kind referral and the opportunity to participate in the care of this patient. A copy of today's note is routed to referring provider   Earlie Server, MD,  PhD 04/26/2021

## 2021-04-26 NOTE — Addendum Note (Signed)
Addended by: Rickard Patience on: 04/26/2021 08:41 PM   Modules accepted: Orders

## 2021-06-20 ENCOUNTER — Encounter: Payer: Self-pay | Admitting: Obstetrics and Gynecology

## 2021-06-20 ENCOUNTER — Other Ambulatory Visit: Payer: Self-pay | Admitting: Obstetrics and Gynecology

## 2021-06-20 DIAGNOSIS — Z3049 Encounter for surveillance of other contraceptives: Secondary | ICD-10-CM

## 2021-06-20 MED ORDER — ETONOGESTREL-ETHINYL ESTRADIOL 0.12-0.015 MG/24HR VA RING: VAGINAL_RING | VAGINAL | 14 refills | Status: DC

## 2021-06-20 NOTE — Progress Notes (Signed)
Nuva Ring rx sent ?

## 2021-06-20 NOTE — Telephone Encounter (Signed)
Nuvaring rx sent

## 2021-07-24 ENCOUNTER — Inpatient Hospital Stay: Payer: Managed Care, Other (non HMO) | Attending: Oncology

## 2021-07-25 ENCOUNTER — Encounter: Payer: Self-pay | Admitting: Oncology

## 2021-07-25 ENCOUNTER — Inpatient Hospital Stay: Payer: Managed Care, Other (non HMO) | Admitting: Oncology

## 2021-11-29 ENCOUNTER — Ambulatory Visit (INDEPENDENT_AMBULATORY_CARE_PROVIDER_SITE_OTHER): Payer: Managed Care, Other (non HMO) | Admitting: Family Medicine

## 2021-11-29 ENCOUNTER — Encounter: Payer: Self-pay | Admitting: Family Medicine

## 2021-11-29 VITALS — BP 108/70 | HR 68 | Ht 62.0 in | Wt 116.0 lb

## 2021-11-29 DIAGNOSIS — Z862 Personal history of diseases of the blood and blood-forming organs and certain disorders involving the immune mechanism: Secondary | ICD-10-CM

## 2021-11-29 DIAGNOSIS — R5383 Other fatigue: Secondary | ICD-10-CM | POA: Diagnosis not present

## 2021-11-29 DIAGNOSIS — F419 Anxiety disorder, unspecified: Secondary | ICD-10-CM

## 2021-11-29 DIAGNOSIS — F32A Depression, unspecified: Secondary | ICD-10-CM

## 2021-11-29 NOTE — Progress Notes (Signed)
Date:  11/29/2021   Name:  Kathleen Small   DOB:  [birth year]   MRN:  [id number]   Chief Complaint: Fatigue and Depression (14 and 9)  Depression        This is a chronic problem.  The current episode started more than 1 year ago.   The problem occurs daily.  The problem has been waxing and waning since onset.  Associated symptoms include decreased concentration, fatigue, insomnia, restlessness and sad.  Past treatments include SSRIs - Selective serotonin reuptake inhibitors.  Previous treatment provided mild relief.  Past medical history includes thyroid problem.   Thyroid Problem Presents for follow-up visit. Symptoms include anxiety, cold intolerance, depressed mood and fatigue. Patient reports no constipation, diarrhea, dry skin, hair loss, heat intolerance, menstrual problem, nail problem, weight gain or weight loss.    Lab Results  Component Value Date   NA 138 04/19/2021   K 4.5 04/19/2021   CO2 22 04/19/2021   GLUCOSE 81 04/19/2021   BUN 8 04/19/2021   CREATININE 0.71 04/19/2021   CALCIUM 9.2 04/19/2021   EGFR 115 04/19/2021   No results found for: "CHOL", "HDL", "LDLCALC", "LDLDIRECT", "TRIG", "CHOLHDL" No results found for: "TSH" No results found for: "HGBA1C" Lab Results  Component Value Date   WBC 5.5 04/19/2021   HGB 10.3 (L) 04/19/2021   HCT 33.6 (L) 04/19/2021   MCV 77 (L) 04/19/2021   Lab Results  Component Value Date   ALT 15 04/19/2021   AST 19 04/19/2021   ALKPHOS 43 (L) 04/19/2021   BILITOT 0.4 04/19/2021   Lab Results  Component Value Date   VD25OH 31.6 04/19/2021     Review of Systems  Constitutional:  Positive for fatigue. Negative for weight gain and weight loss.  Gastrointestinal:  Negative for constipation and diarrhea.  Endocrine: Positive for cold intolerance. Negative for heat intolerance.  Genitourinary:  Negative for menstrual problem.  Psychiatric/Behavioral:  Positive for decreased concentration and depression. The patient is  nervous/anxious and has insomnia.     Patient Active Problem List   Diagnosis Date Noted   IDA (iron deficiency anemia) 04/26/2021    Allergies  Allergen Reactions   Reglan [Metoclopramide] Other (See Comments)    Formication (felt like insects crawling all over her)    Past Surgical History:  Procedure Laterality Date   APPENDECTOMY     LASIK Bilateral     Social History   Tobacco Use   Smoking status: Never   Smokeless tobacco: Never  Vaping Use   Vaping Use: Never used  Substance Use Topics   Alcohol use: Yes    Comment: social   Drug use: No     Medication list has been reviewed and updated.  Current Meds  Medication Sig   etonogestrel-ethinyl estradiol (NUVARING) 0.12-0.015 MG/24HR vaginal ring Insert vaginally and leave in place for 3 consecutive weeks. Remove and replace with a new Nuvaring after 3 weeks for therapeutic amenorrhea   polyethylene glycol (MIRALAX / GLYCOLAX) 17 g packet Take 17 g by mouth daily.   sertraline (ZOLOFT) 100 MG tablet Take 1 tablet (100 mg total) by mouth daily.       11/29/2021   11:06 AM 04/19/2021    1:48 PM 09/07/2019    8:21 AM 06/10/2019    3:15 PM  GAD 7 : Generalized Anxiety Score  Nervous, Anxious, on Edge 2 3 1 2   Control/stop worrying 2 3 3 2   Worry too much - different things 2 3  3 2  Trouble relaxing 1 0 0 2  Restless 1 0 0 0  Easily annoyed or irritable 1 1 0 2  Afraid - awful might happen 0 1 0 0  Total GAD 7 Score 9 11 7 10   Anxiety Difficulty Very difficult Very difficult Extremely difficult Somewhat difficult       11/29/2021   11:05 AM 04/19/2021    1:48 PM 09/07/2019    8:20 AM  Depression screen PHQ 2/9  Decreased Interest 3 3 0  Down, Depressed, Hopeless 1 1 2   PHQ - 2 Score 4 4 2   Altered sleeping 3 3 0  Tired, decreased energy 3 2 0  Change in appetite 0 0 0  Feeling bad or failure about yourself  2 1 1   Trouble concentrating 1 0 0  Moving slowly or fidgety/restless 1 0 0  Suicidal thoughts 0  0 0  PHQ-9 Score 14 10 3   Difficult doing work/chores Extremely dIfficult Somewhat difficult Somewhat difficult    BP Readings from Last 3 Encounters:  11/29/21 108/70  04/26/21 121/71  04/19/21 112/70    Physical Exam  Wt Readings from Last 3 Encounters:  11/29/21 116 lb (52.6 kg)  04/26/21 117 lb (53.1 kg)  04/19/21 114 lb 6.4 oz (51.9 kg)    BP 108/70   Pulse 68   Ht 5' 2"  (1.575 m)   Wt 116 lb (52.6 kg)   BMI 21.22 kg/m   Assessment and Plan:  1. Fatigue, unspecified type New onset.  Persistent.  Stable.  Patient has had histories of anemia and anxiety and depression we will rule out her thyroid concerns as well as repeating iron TIBC and ferritin panel and renal function panel as part of a rule out fatigue evaluation. - Thyroid Panel With TSH - Iron, TIBC and Ferritin Panel - Renal Function Panel  2. History of anemia Chronic.  Persistent.  Stable.  In the past to be rechecked to see if still present.  Has a history of a microcytic anemia for which patient has no longer doing blood donations however we will recheck iron TIBC and a CBC to see if this is persistent and if so we will likely treat with supplemental iron. - Iron, TIBC and Ferritin Panel - CBC with Differential/Platelet  3. Anxiety and depression Chronic.  Controlled.  Stable.  PHQ is 13 GAD score is 9 GAD score has significantly improved as well as the patient's PTSD obsessive-compulsive behavior on the sertraline 100 mg once a day.  Next step will be to probably add Wellbutrin but we will obtain labs first and then determine whether or not further steps to combine with sertraline and Wellbutrin will be necessary.   Otilio Miu, MD

## 2021-11-30 ENCOUNTER — Other Ambulatory Visit: Payer: Self-pay

## 2021-11-30 DIAGNOSIS — F419 Anxiety disorder, unspecified: Secondary | ICD-10-CM

## 2021-11-30 LAB — RENAL FUNCTION PANEL
Albumin: 4.5 g/dL (ref 3.9–4.9)
BUN/Creatinine Ratio: 10 (ref 9–23)
BUN: 9 mg/dL (ref 6–20)
CO2: 21 mmol/L (ref 20–29)
Calcium: 9.7 mg/dL (ref 8.7–10.2)
Chloride: 102 mmol/L (ref 96–106)
Creatinine, Ser: 0.88 mg/dL (ref 0.57–1.00)
Glucose: 79 mg/dL (ref 70–99)
Phosphorus: 4 mg/dL (ref 3.0–4.3)
Potassium: 4.4 mmol/L (ref 3.5–5.2)
Sodium: 138 mmol/L (ref 134–144)
eGFR: 88 mL/min/{1.73_m2} (ref >59)

## 2021-11-30 LAB — CBC WITH DIFFERENTIAL/PLATELET
Basophils Absolute: 0.1 10*3/uL (ref 0.0–0.2)
Basos: 1 %
EOS (ABSOLUTE): 0.2 10*3/uL (ref 0.0–0.4)
Eos: 2 %
Hematocrit: 46.1 % (ref 34.0–46.6)
Hemoglobin: 15.3 g/dL (ref 11.1–15.9)
Immature Grans (Abs): 0 10*3/uL (ref 0.0–0.1)
Immature Granulocytes: 0 %
Lymphocytes Absolute: 2.2 10*3/uL (ref 0.7–3.1)
Lymphs: 32 %
MCH: 29.3 pg (ref 26.6–33.0)
MCHC: 33.2 g/dL (ref 31.5–35.7)
MCV: 88 fL (ref 79–97)
Monocytes Absolute: 0.4 10*3/uL (ref 0.1–0.9)
Monocytes: 6 %
Neutrophils Absolute: 4 10*3/uL (ref 1.4–7.0)
Neutrophils: 59 %
Platelets: 311 10*3/uL (ref 150–450)
RBC: 5.23 x10E6/uL (ref 3.77–5.28)
RDW: 13 % (ref 11.7–15.4)
WBC: 6.9 10*3/uL (ref 3.4–10.8)

## 2021-11-30 LAB — IRON,TIBC AND FERRITIN PANEL
Ferritin: 31 ng/mL (ref 15–150)
Iron Saturation: 27 % (ref 15–55)
Iron: 115 ug/dL (ref 27–159)
Total Iron Binding Capacity: 423 ug/dL (ref 250–450)
UIBC: 308 ug/dL (ref 131–425)

## 2021-11-30 LAB — THYROID PANEL WITH TSH
Free Thyroxine Index: 2.1 (ref 1.2–4.9)
T3 Uptake Ratio: 20 % — ABNORMAL LOW (ref 24–39)
T4, Total: 10.3 ug/dL (ref 4.5–12.0)
TSH: 1.13 u[IU]/mL (ref 0.450–4.500)

## 2021-11-30 MED ORDER — BUPROPION HCL ER (XL) 150 MG PO TB24: 150.0000 mg | ORAL_TABLET | Freq: Every day | ORAL | 1 refills | Status: DC

## 2021-11-30 NOTE — Progress Notes (Signed)
Sent in wellbutrin 

## 2022-01-03 ENCOUNTER — Encounter: Payer: Self-pay | Admitting: Family Medicine

## 2022-01-04 ENCOUNTER — Other Ambulatory Visit: Payer: Self-pay

## 2022-01-04 DIAGNOSIS — F32A Depression, unspecified: Secondary | ICD-10-CM

## 2022-01-04 MED ORDER — BUPROPION HCL ER (XL) 150 MG PO TB24: 150.0000 mg | ORAL_TABLET | Freq: Every day | ORAL | 0 refills | Status: DC

## 2022-04-11 ENCOUNTER — Ambulatory Visit (INDEPENDENT_AMBULATORY_CARE_PROVIDER_SITE_OTHER): Payer: Managed Care, Other (non HMO) | Admitting: Family Medicine

## 2022-04-11 ENCOUNTER — Encounter: Payer: Self-pay | Admitting: Family Medicine

## 2022-04-11 VITALS — BP 120/72 | HR 78 | Ht 62.0 in | Wt 114.0 lb

## 2022-04-11 DIAGNOSIS — F419 Anxiety disorder, unspecified: Secondary | ICD-10-CM | POA: Diagnosis not present

## 2022-04-11 DIAGNOSIS — F32A Depression, unspecified: Secondary | ICD-10-CM

## 2022-04-11 MED ORDER — SERTRALINE HCL 100 MG PO TABS: 100.0000 mg | ORAL_TABLET | Freq: Every day | ORAL | 1 refills | Status: DC

## 2022-04-11 MED ORDER — BUPROPION HCL ER (XL) 150 MG PO TB24: 150.0000 mg | ORAL_TABLET | Freq: Every day | ORAL | 1 refills | Status: DC

## 2022-04-11 NOTE — Progress Notes (Signed)
Date:  04/11/2022   Name:  Kathleen Small   DOB:  Mar 04, 1986   MRN:  [id number]   Chief Complaint: Depression (Added bupropion to sertraline)  Depression        This is a chronic problem.  The current episode started more than 1 year ago.   The onset quality is gradual.   The problem has been gradually improving since onset.  Associated symptoms include fatigue and insomnia.  Associated symptoms include no helplessness, no hopelessness, not irritable, no restlessness, no decreased interest, no appetite change, no body aches, no myalgias, no headaches, no indigestion, not sad and no suicidal ideas.  Past treatments include SSRIs - Selective serotonin reuptake inhibitors and other medications.  Previous treatment provided mild relief.   Lab Results  Component Value Date   NA 138 11/29/2021   K 4.4 11/29/2021   CO2 21 11/29/2021   GLUCOSE 79 11/29/2021   BUN 9 11/29/2021   CREATININE 0.88 11/29/2021   CALCIUM 9.7 11/29/2021   EGFR 88 11/29/2021   No results found for: "CHOL", "HDL", "LDLCALC", "LDLDIRECT", "TRIG", "CHOLHDL" Lab Results  Component Value Date   TSH 1.130 11/29/2021   No results found for: "HGBA1C" Lab Results  Component Value Date   WBC 6.9 11/29/2021   HGB 15.3 11/29/2021   HCT 46.1 11/29/2021   MCV 88 11/29/2021   PLT 311 11/29/2021   Lab Results  Component Value Date   ALT 15 04/19/2021   AST 19 04/19/2021   ALKPHOS 43 (L) 04/19/2021   BILITOT 0.4 04/19/2021   Lab Results  Component Value Date   VD25OH 31.6 04/19/2021     Review of Systems  Constitutional:  Positive for fatigue. Negative for appetite change, chills, fever and unexpected weight change.  HENT:  Negative for congestion, ear discharge, rhinorrhea, sinus pressure, sneezing and sore throat.   Respiratory:  Negative for cough, chest tightness, shortness of breath, wheezing and stridor.   Cardiovascular:  Negative for chest pain and palpitations.  Gastrointestinal:  Negative for  abdominal pain, blood in stool and nausea.  Genitourinary:  Negative for difficulty urinating, dysuria, flank pain, frequency, hematuria and urgency.  Musculoskeletal:  Negative for arthralgias, back pain and myalgias.  Skin:  Negative for rash.  Neurological:  Negative for dizziness, weakness and headaches.  Hematological:  Negative for adenopathy. Does not bruise/bleed easily.  Psychiatric/Behavioral:  Positive for depression. Negative for suicidal ideas. The patient has insomnia. The patient is not nervous/anxious.     Patient Active Problem List   Diagnosis Date Noted   IDA (iron deficiency anemia) 04/26/2021    Allergies  Allergen Reactions   Reglan [Metoclopramide] Other (See Comments)    Formication (felt like insects crawling all over her)    Past Surgical History:  Procedure Laterality Date   APPENDECTOMY     LASIK Bilateral     Social History   Tobacco Use   Smoking status: Never   Smokeless tobacco: Never  Vaping Use   Vaping Use: Never used  Substance Use Topics   Alcohol use: Yes    Comment: social   Drug use: No     Medication list has been reviewed and updated.  Current Meds  Medication Sig   buPROPion (WELLBUTRIN XL) 150 MG 24 hr tablet Take 1 tablet (150 mg total) by mouth daily.   etonogestrel-ethinyl estradiol (NUVARING) 0.12-0.015 MG/24HR vaginal ring Insert vaginally and leave in place for 3 consecutive weeks. Remove and replace with a new Nuvaring after  3 weeks for therapeutic amenorrhea   polyethylene glycol (MIRALAX / GLYCOLAX) 17 g packet Take 17 g by mouth daily.   sertraline (ZOLOFT) 100 MG tablet Take 1 tablet (100 mg total) by mouth daily.       04/11/2022   10:23 AM 11/29/2021   11:06 AM 04/19/2021    1:48 PM 09/07/2019    8:21 AM  GAD 7 : Generalized Anxiety Score  Nervous, Anxious, on Edge 0 _0 Control/stop worrying 0 _1 Worry too much - different things 0 _2 Trouble relaxing 0 1 0 0  Restless 0 1 0 0  Easily  annoyed or irritable 0 1 1 0  Afraid - awful might happen 0 0 1 0  Total GAD 7 Score 0 _3 Anxiety Difficulty Not difficult at all Very difficult Very difficult Extremely difficult       04/11/2022   10:23 AM 11/29/2021   11:05 AM 04/19/2021    1:48 PM  Depression screen PHQ 2/9  Decreased Interest 0 3 3  Down, Depressed, Hopeless 0 1 1  PHQ - 2 Score 0 4 4  Altered sleeping _4 Tired, decreased energy _5 Change in appetite 0 0 0  Feeling bad or failure about yourself  0 2 1  Trouble concentrating 0 1 0  Moving slowly or fidgety/restless 0 1 0  Suicidal thoughts 0 0 0  PHQ-9 Score _6 Difficult doing work/chores Somewhat difficult Extremely dIfficult Somewhat difficult    BP Readings from Last 3 Encounters:  04/11/22 120/72  11/29/21 108/70  04/26/21 121/71    Physical Exam Vitals and nursing note reviewed. Exam conducted with a chaperone present.  Constitutional:      General: She is not irritable.She is not in acute distress.    Appearance: She is not diaphoretic.  HENT:     Head: Normocephalic and atraumatic.     Right Ear: Tympanic membrane and external ear normal.     Left Ear: Tympanic membrane and external ear normal.     Nose: Nose normal. No congestion or rhinorrhea.     Mouth/Throat:     Mouth: Mucous membranes are moist.  Eyes:     General:        Right eye: No discharge.        Left eye: No discharge.     Conjunctiva/sclera: Conjunctivae normal.     Pupils: Pupils are equal, round, and reactive to light.  Neck:     Thyroid: No thyromegaly.     Vascular: No carotid bruit or JVD.  Cardiovascular:     Rate and Rhythm: Normal rate and regular rhythm.     Heart sounds: Normal heart sounds. No murmur heard.    No friction rub. No gallop.  Pulmonary:     Effort: Pulmonary effort is normal.     Breath sounds: Normal breath sounds. No wheezing, rhonchi or rales.  Abdominal:     General: Bowel sounds are normal.     Palpations: Abdomen is  soft. There is no mass.     Tenderness: There is no abdominal tenderness. There is no guarding.  Musculoskeletal:     Cervical back: Neck supple.  Lymphadenopathy:     Cervical: No cervical adenopathy.  Neurological:     Mental Status: She is alert.     Wt Readings from Last 3 Encounters:  04/11/22 114 lb (51.7 kg)  11/29/21 116 lb (52.6 kg)  04/26/21 117 lb (53.1 kg)    BP 120/72   Pulse 78   Ht 5' 2" (1.575 m)   Wt 114 lb (51.7 kg)   SpO2 98%   BMI 20.85 kg/m   Assessment and Plan: 1. Anxiety and depression Chronic.  Controlled.  Stable.  PHQ is 3.  GAD score is 0.  Continue bupropion XL 150 mg once a day and sertraline 100 mg once a day.  Will recheck patient in 6 months. - buPROPion (WELLBUTRIN XL) 150 MG 24 hr tablet; Take 1 tablet (150 mg total) by mouth daily.  Dispense: 90 tablet; Refill: 1 - sertraline (ZOLOFT) 100 MG tablet; Take 1 tablet (100 mg total) by mouth daily.  Dispense: 90 tablet; Refill: 1     Deanna Jones, MD  

## 2022-05-10 ENCOUNTER — Encounter: Payer: Self-pay | Admitting: Family Medicine

## 2022-05-16 ENCOUNTER — Telehealth: Payer: Self-pay

## 2022-05-16 DIAGNOSIS — Z3049 Encounter for surveillance of other contraceptives: Secondary | ICD-10-CM

## 2022-05-16 MED ORDER — ETONOGESTREL-ETHINYL ESTRADIOL 0.12-0.015 MG/24HR VA RING: VAGINAL_RING | VAGINAL | 1 refills | Status: DC

## 2022-05-16 NOTE — Telephone Encounter (Signed)
Pt called triage requesting BC RF. She is past due for her annual. Pls schedule annual, once scheduled, send msg back to me so I can send RF to [facility].

## 2022-05-16 NOTE — Telephone Encounter (Signed)
Pt aware Rx RF sent.

## 2022-05-31 ENCOUNTER — Ambulatory Visit (INDEPENDENT_AMBULATORY_CARE_PROVIDER_SITE_OTHER): Payer: Managed Care, Other (non HMO) | Admitting: Licensed Practical Nurse

## 2022-05-31 VITALS — BP 108/36 | HR 85 | Ht 63.0 in | Wt 114.1 lb

## 2022-05-31 DIAGNOSIS — Z30011 Encounter for initial prescription of contraceptive pills: Secondary | ICD-10-CM

## 2022-05-31 DIAGNOSIS — Z01419 Encounter for gynecological examination (general) (routine) without abnormal findings: Secondary | ICD-10-CM

## 2022-05-31 MED ORDER — NORGESTIMATE-ETH ESTRADIOL 0.25-35 MG-MCG PO TABS: 1.0000 | ORAL_TABLET | Freq: Every day | ORAL | 11 refills | Status: DC

## 2022-05-31 NOTE — Progress Notes (Signed)
Gynecology Annual Exam   PCP: Juline Patch, MD  Chief Complaint:  Chief Complaint  Patient presents with   Gynecologic Exam    History of Present Illness: Patient is a 37 y.o. G0P0000 presents for annual exam. The patient has no complaints today.  The pt desires therapeutic amenorrhea, she has trouble refilling her Nuvaring on time-the pharmacy gives her trouble for refilling script at 3 rather than 4 weeks, would like to try OCP's  LMP: No LMP recorded. (Menstrual status: Other). Just had a cycle because it has been three weeks without a Nuvaring, it was heavy and not pleasant.   The patient is not sexually active. She currently uses none for contraception. She denies dyspareunia.  The patient  occasionally   perform self breast exams.  There is remote notable family history of breast or ovarian cancer in her family. Family members that have had genetic testing are all negative.   The patient wears seatbelts: yes.   The patient has regular exercise: yes.  Very active, likes to hike and [place]  The patient reports current symptoms of depression.  Managed with therapy and exercise, not on medication right now. Doing well Works as a PA, now does more administrative work than clinical Lives my herself and her 3 dogs PCP: Dr Ronnald Ramp, seen recently Dental goes every 6 months Eyes: had Lasic in 2016 Derm: has not gone   Review of Systems: Review of Systems  Psychiatric/Behavioral:  The patient is nervous/anxious.        Reports "no change, this is normal"    Past Medical History:  Patient Active Problem List   Diagnosis Date Noted   IDA (iron deficiency anemia) 04/26/2021    Past Surgical History:  Past Surgical History:  Procedure Laterality Date   APPENDECTOMY     LASIK Bilateral     Gynecologic History:  No LMP recorded. (Menstrual status: Other). Contraception:  desires script for OCP's, currently not sexually active  Last Pap: Results were: no  abnormalities 05/09/2021  Obstetric History: G0P0000  Family History:  Family History  Problem Relation Age of Onset   Hypertension Mother    Hyperlipidemia Mother    Osteoporosis Mother    Hyperlipidemia Father    Diabetes Father    Hypertension Father    Heart disease Father    Obesity Father    Sleep apnea Father    Kidney disease Father    Other Father        has pacemaker   Breast cancer Maternal Grandmother    Hypertension Maternal Grandmother    Stroke Maternal Grandmother    Diabetes Maternal Grandfather    Hypertension Paternal Grandmother    Breast cancer Paternal Grandmother    Ovarian cancer Maternal Great-grandmother     Social History:  Social History   Socioeconomic History   Marital status: Single    Spouse name: Not on file   Number of children: Not on file   Years of education: Not on file   Highest education level: Not on file  Occupational History   Not on file  Tobacco Use   Smoking status: Never   Smokeless tobacco: Never  Vaping Use   Vaping Use: Never used  Substance and Sexual Activity   Alcohol use: Yes    Comment: social   Drug use: No   Sexual activity: Not Currently    Birth control/protection: Implant  Other Topics Concern   Not on file  Social History  Narrative   Not on file   Social Determinants of Health   Financial Resource Strain: Not on file  Food Insecurity: Not on file  Transportation Needs: Not on file  Physical Activity: Sufficiently Active (05/08/2017)   Exercise Vital Sign    Days of Exercise per Week: 3 days    Minutes of Exercise per Session: 60 min  Stress: No Stress Concern Present (05/08/2017)   [place]    Feeling of Stress : Only a little  Social Connections: Moderately Integrated (05/08/2017)   Social Connection and Isolation Panel [NHANES]    Frequency of Communication with Friends and Family: More than three times a week    Frequency  of Social Gatherings with Friends and Family: Three times a week    Attends Religious Services: More than 4 times per year    Active Member of Clubs or Organizations: Yes    Attends Archivist Meetings: More than 4 times per year    Marital Status: Never married  Intimate Partner Violence: Not At Risk (05/08/2017)   Humiliation, Afraid, Rape, and Kick questionnaire    Fear of Current or Ex-Partner: No    Emotionally Abused: No    Physically Abused: No    Sexually Abused: No    Allergies:  Allergies  Allergen Reactions   Reglan [Metoclopramide] Other (See Comments)    Formication (felt like insects crawling all over her)    Medications: Prior to Admission medications   Medication Sig Start Date End Date Taking? Authorizing Provider  polyethylene glycol (MIRALAX / GLYCOLAX) 17 g packet Take 17 g by mouth daily.   Yes [provider]  buPROPion (WELLBUTRIN XL) 150 MG 24 hr tablet Take 1 tablet (150 mg total) by mouth daily. Patient not taking: Reported on 05/31/2022 04/11/22   Juline Patch, MD  etonogestrel-ethinyl estradiol (NUVARING) 0.12-0.015 MG/24HR vaginal ring Insert vaginally and leave in place for 3 consecutive weeks. Remove and replace with a new Nuvaring after 3 weeks for therapeutic amenorrhea Patient not taking: Reported on 05/31/2022 05/16/22   Allen Derry, CNM  sertraline (ZOLOFT) 100 MG tablet Take 1 tablet (100 mg total) by mouth daily. Patient not taking: Reported on 05/31/2022 04/11/22   Juline Patch, MD    Physical Exam Vitals: Blood pressure (!) 108/36, pulse 85, height 5' 3"$  (1.6 m), weight 114 lb 1.6 oz (51.8 kg).  General: NAD HEENT: normocephalic, anicteric Thyroid: no enlargement, no palpable nodules Pulmonary: No increased work of breathing, CTAB Cardiovascular: RRR, distal pulses 2+ Breast: Breast symmetrical, no tenderness, no palpable nodules or masses, no skin or nipple retraction present, no nipple discharge.  No  axillary or supraclavicular lymphadenopathy. Abdomen: NABS, soft, non-tender, non-distended.  Umbilicus without lesions.  No hepatomegaly, splenomegaly or masses palpable. No evidence of hernia  Genitourinary:  External: Normal external female genitalia.  Normal urethral meatus, normal Bartholin's and Skene's glands.    Vagina: Normal vaginal mucosa, no evidence of prolapse.  Only able to insert one finger into vaginal opening.   Cervix: Grossly normal in appearance, no bleeding  Uterus: Non-enlarged, mobile, normal contour.  No CMT  Adnexa: ovaries non-enlarged, no adnexal masses  Rectal: deferred  Lymphatic: no evidence of inguinal lymphadenopathy Extremities: no edema, erythema, or tenderness Neurologic: Grossly intact Psychiatric: mood appropriate, affect full    Assessment: 37 y.o. G0P0000 routine annual exam  Plan: Problem List Items Addressed This Visit   None   2) STI screening  wasoffered and declined  2)  ASCCP guidelines and rational discussed.  Patient opts for every 5 years screening interval  3) Contraception - the patient is currently using  NuvaRing vaginal inserts.  She is interested in changing to OCP's , script sent   4) Routine healthcare maintenance including cholesterol, diabetes screening discussed managed by PCP  5) No follow-ups on file.  Roberto Scales, [facility], [facility] 05/31/2022, 9:11 AM

## 2022-06-05 ENCOUNTER — Other Ambulatory Visit: Payer: Self-pay | Admitting: Licensed Practical Nurse

## 2022-06-05 ENCOUNTER — Encounter: Payer: Self-pay | Admitting: Licensed Practical Nurse

## 2022-06-05 DIAGNOSIS — Z30011 Encounter for initial prescription of contraceptive pills: Secondary | ICD-10-CM

## 2022-06-05 DIAGNOSIS — Z01419 Encounter for gynecological examination (general) (routine) without abnormal findings: Secondary | ICD-10-CM

## 2022-06-05 MED ORDER — NORGESTIMATE-ETH ESTRADIOL 0.25-35 MG-MCG PO TABS: 1.0000 | ORAL_TABLET | Freq: Every day | ORAL | 3 refills | Status: DC

## 2023-04-03 ENCOUNTER — Other Ambulatory Visit: Payer: Self-pay | Admitting: Medical Genetics

## 2023-04-16 ENCOUNTER — Other Ambulatory Visit: Payer: Self-pay

## 2023-04-18 ENCOUNTER — Other Ambulatory Visit: Payer: Self-pay

## 2023-04-25 ENCOUNTER — Other Ambulatory Visit: Payer: Self-pay

## 2023-04-27 ENCOUNTER — Other Ambulatory Visit: Payer: Self-pay

## 2023-05-01 ENCOUNTER — Other Ambulatory Visit: Payer: Self-pay | Attending: Medical Genetics

## 2024-01-13 ENCOUNTER — Other Ambulatory Visit: Payer: Self-pay

## 2024-01-13 ENCOUNTER — Encounter: Payer: Self-pay | Admitting: Oncology

## 2024-01-13 MED ORDER — FLUZONE 0.5 ML IM SUSY
0.5000 mL | PREFILLED_SYRINGE | Freq: Once | INTRAMUSCULAR | 0 refills | Status: AC
  Filled 2024-01-13: qty 0.5, 1d supply, fill #0

## 2024-01-13 MED ORDER — COMIRNATY 30 MCG/0.3ML IM SUSY
0.3000 mL | PREFILLED_SYRINGE | Freq: Once | INTRAMUSCULAR | 0 refills | Status: AC
  Filled 2024-01-13: qty 0.3, 1d supply, fill #0

## 2024-01-31 ENCOUNTER — Other Ambulatory Visit: Payer: Self-pay | Admitting: Medical Genetics

## 2024-01-31 DIAGNOSIS — Z006 Encounter for examination for normal comparison and control in clinical research program: Secondary | ICD-10-CM

## 2024-02-03 ENCOUNTER — Encounter: Payer: Self-pay | Admitting: Oncology

## 2024-02-04 ENCOUNTER — Encounter: Payer: Self-pay | Admitting: Student

## 2024-02-04 ENCOUNTER — Ambulatory Visit: Admitting: Student

## 2024-02-04 ENCOUNTER — Encounter: Payer: Self-pay | Admitting: Oncology

## 2024-02-04 VITALS — BP 100/68 | HR 69 | Ht 63.0 in | Wt 107.1 lb

## 2024-02-04 DIAGNOSIS — D508 Other iron deficiency anemias: Secondary | ICD-10-CM

## 2024-02-04 DIAGNOSIS — F411 Generalized anxiety disorder: Secondary | ICD-10-CM

## 2024-02-04 MED ORDER — HYDROXYZINE HCL 10 MG PO TABS
10.0000 mg | ORAL_TABLET | Freq: Three times a day (TID) | ORAL | 0 refills | Status: AC | PRN
  Filled 2024-02-07: qty 30, 10d supply, fill #0

## 2024-02-04 MED ORDER — DULOXETINE HCL 30 MG PO CPEP
30.0000 mg | ORAL_CAPSULE | Freq: Every day | ORAL | 1 refills | Status: DC
  Filled 2024-02-06: qty 30, 30d supply, fill #0

## 2024-02-04 NOTE — Progress Notes (Unsigned)
 Established Patient Office Visit  Subjective   Patient ID: Kathleen Small, female    DOB: 05/08/1985  Age: 38 y.o. MRN: [id number]  Chief Complaint  Patient presents with   Establish Care    Kathleen Small is a 37 y.o. person  with medical hx listed below presents today for transfer of care. Previously seeing Dr. Joshua. Has been having more anxiety with associated panic attack and insomnia. Has not had panic attacks in the past. Has been off medications due to side effect of sleepiness with zoloft . Denies SI, HI, AVD.    Patient Active Problem List   Diagnosis Date Noted   GAD (generalized anxiety disorder) 02/05/2024   IDA (iron deficiency anemia) 04/26/2021   ROS Refer to HPI    Objective:     Outpatient Encounter Medications as of 02/04/2024  Medication Sig   DULoxetine (CYMBALTA) 30 MG capsule Take 1 capsule (30 mg total) by mouth daily.   hydrOXYzine (ATARAX) 10 MG tablet Take 1 tablet (10 mg total) by mouth 3 (three) times daily as needed.   polyethylene glycol (MIRALAX / GLYCOLAX) 17 g packet Take 17 g by mouth daily. (Patient not taking: Take 17 g by mouth daily.)   [DISCONTINUED] buPROPion  (WELLBUTRIN  XL) 150 MG 24 hr tablet Take 1 tablet (150 mg total) by mouth daily. (Patient not taking: Reported on 02/04/2024)   [DISCONTINUED] norgestimate -ethinyl estradiol  (ORTHO-CYCLEN) 0.25-35 MG-MCG tablet Take 1 tablet by mouth daily. May skip placebo week (Patient not taking: Reported on 02/04/2024)   [DISCONTINUED] sertraline  (ZOLOFT ) 100 MG tablet Take 1 tablet (100 mg total) by mouth daily. (Patient not taking: Reported on 02/04/2024)   No facility-administered encounter medications on file as of 02/04/2024.    BP 100/68   Pulse 69   Ht 5' 3 (1.6 m)   Wt 107 lb 2 oz (48.6 kg)   LMP 01/27/2024   SpO2 99%   BMI 18.98 kg/m  BP Readings from Last 3 Encounters:  02/04/24 100/68  05/31/22 (!) 108/36  04/11/22 120/72    Physical Exam Constitutional:       Appearance: Normal appearance.  HENT:     Mouth/Throat:     Mouth: Mucous membranes are moist.     Pharynx: Oropharynx is clear.  Cardiovascular:     Rate and Rhythm: Normal rate and regular rhythm.  Pulmonary:     Effort: Pulmonary effort is normal.     Breath sounds: No rhonchi or rales.  Abdominal:     General: Abdomen is flat. Bowel sounds are normal. There is no distension.     Palpations: Abdomen is soft.     Tenderness: There is no abdominal tenderness.  Musculoskeletal:        General: Normal range of motion.     Right lower leg: No edema.     Left lower leg: No edema.  Skin:    General: Skin is warm and dry.     Capillary Refill: Capillary refill takes less than 2 seconds.  Neurological:     General: No focal deficit present.     Mental Status: She is alert and oriented to person, place, and time.  Psychiatric:        Mood and Affect: Mood normal.        Behavior: Behavior normal.        02/04/2024   10:50 AM 04/11/2022   10:23 AM 11/29/2021   11:05 AM  Depression screen PHQ 2/9  Decreased Interest 1 0 3  Down,  Depressed, Hopeless 0 0 1  PHQ - 2 Score 1 0 4  Altered sleeping 3 1 3   Tired, decreased energy 2 2 3   Change in appetite 2 0 0  Feeling bad or failure about yourself  0 0 2  Trouble concentrating 0 0 1  Moving slowly or fidgety/restless 0 0 1  Suicidal thoughts 0 0 0  PHQ-9 Score 8 3 14   Difficult doing work/chores Somewhat difficult Somewhat difficult Extremely dIfficult       02/04/2024   10:50 AM 04/11/2022   10:23 AM 11/29/2021   11:06 AM 04/19/2021    1:48 PM  GAD 7 : Generalized Anxiety Score  Nervous, Anxious, on Edge 3 0 2 3  Control/stop worrying 3 0 2 3  Worry too much - different things 3 0 2 3  Trouble relaxing 0 0 1 0  Restless 0 0 1 0  Easily annoyed or irritable 2 0 1 1  Afraid - awful might happen 1 0 0 1  Total GAD 7 Score 12 0 9 11  Anxiety Difficulty Somewhat difficult Not difficult at all Very difficult Very difficult     No results found for any visits on 02/04/24.  Last CBC Lab Results  Component Value Date   WBC 6.9 11/29/2021   HGB 15.3 11/29/2021   HCT 46.1 11/29/2021   MCV 88 11/29/2021   MCH 29.3 11/29/2021   RDW 13.0 11/29/2021   PLT 311 11/29/2021   Last metabolic panel Lab Results  Component Value Date   GLUCOSE 79 11/29/2021   NA 138 11/29/2021   K 4.4 11/29/2021   CL 102 11/29/2021   CO2 21 11/29/2021   BUN 9 11/29/2021   CREATININE 0.88 11/29/2021   EGFR 88 11/29/2021   CALCIUM 9.7 11/29/2021   PHOS 4.0 11/29/2021   PROT 6.9 04/19/2021   ALBUMIN 4.5 11/29/2021   LABGLOB 2.1 04/19/2021   AGRATIO 2.3 (H) 04/19/2021   BILITOT 0.4 04/19/2021   ALKPHOS 43 (L) 04/19/2021   AST 19 04/19/2021   ALT 15 04/19/2021   Last lipids No results found for: CHOL, HDL, LDLCALC, LDLDIRECT, TRIG, CHOLHDL Last hemoglobin A1c No results found for: HGBA1C Last thyroid  functions Lab Results  Component Value Date   TSH 1.130 11/29/2021   T4TOTAL 10.3 11/29/2021      The ASCVD Risk score (Arnett DK, et al., 2019) failed to calculate for the following reasons:   The 2019 ASCVD risk score is only valid for ages 52 to 79    Assessment & Plan:  GAD (generalized anxiety disorder) Assessment & Plan: Previously on sertraline  and bupropion  but has not been on this for about a year. Felts very sleepy on zoloft  that has improved after she stopped. Having more anxiety lately. Feels she worries and over thinks most things. Last week had a panic attack whole working. Denies inciting incident. Felt light headed, heart was racing, and dizzy. Also has insomnia due to anxiety. Reports she has difficulty with maintaining good sleep hygiene.  She was able to calm down after sitting quietly after 30-60 minutes. Was seeing a therapist regularly but recent changed insurance, she has appointment with new therapist next week. Has tried duloxetine in the past for GI complaints in the past and it has  been helpful. Discussed TSH and CBC given past hx and new panic attack.  -Start duloxetine 30 mg daily -Hydroxyzine 10 mg as needed for panic attacks -Sleep hygiene, CBT-I coach -Follow up telehealth in 2 weeks  Other iron deficiency anemia Assessment & Plan: Hemoglobin 15.3 last checked in 2023. Associated with blood donation. Reports HGB was normal recently when she gave blood recently.    Other orders -     hydrOXYzine HCl; Take 1 tablet (10 mg total) by mouth 3 (three) times daily as needed.  Dispense: 30 tablet; Refill: 0 -     DULoxetine HCl; Take 1 capsule (30 mg total) by mouth daily.  Dispense: 30 capsule; Refill: 1     Return in about 8 weeks (around 03/31/2024) for Anxiety, telehealth .    Harlene Saddler, MD

## 2024-02-05 DIAGNOSIS — F411 Generalized anxiety disorder: Secondary | ICD-10-CM | POA: Insufficient documentation

## 2024-02-05 NOTE — Assessment & Plan Note (Signed)
 Hemoglobin 15.3 last checked in 2023. Associated with blood donation. Reports HGB was normal recently when she gave blood recently.

## 2024-02-05 NOTE — Assessment & Plan Note (Addendum)
 GAD is elevated today. Previously on sertraline  and bupropion  but has not been on this for about a year. Felts very sleepy on zoloft  that has improved after she stopped. Having more anxiety lately. Feels she worries and over thinks most things. Last week had a panic attack whole working. Denies inciting incident. Felt light headed, heart was racing, and dizzy. Also has insomnia due to anxiety. Reports she has difficulty with maintaining good sleep hygiene.  She was able to calm down after sitting quietly after 30-60 minutes. Was seeing a therapist regularly but recent changed insurance, she has appointment with new therapist next week. Has tried duloxetine in the past for GI complaints in the past and it has been helpful. Discussed TSH and CBC given past hx and new panic attack.  -Start duloxetine 30 mg daily -Hydroxyzine 10 mg as needed for panic attacks -Sleep hygiene, CBT-I coach -Follow up telehealth in 2 weeks

## 2024-02-06 ENCOUNTER — Other Ambulatory Visit: Payer: Self-pay

## 2024-02-06 ENCOUNTER — Other Ambulatory Visit (HOSPITAL_COMMUNITY): Payer: Self-pay

## 2024-02-07 ENCOUNTER — Other Ambulatory Visit: Payer: Self-pay

## 2024-02-24 ENCOUNTER — Encounter: Payer: Self-pay | Admitting: Student

## 2024-02-24 DIAGNOSIS — F411 Generalized anxiety disorder: Secondary | ICD-10-CM

## 2024-02-24 MED ORDER — DULOXETINE HCL 20 MG PO CPEP: 60.0000 mg | ORAL_CAPSULE | Freq: Every day | ORAL | Status: DC

## 2024-02-24 NOTE — Telephone Encounter (Signed)
 Please review patient's message:

## 2024-02-28 ENCOUNTER — Other Ambulatory Visit: Payer: Self-pay

## 2024-02-28 ENCOUNTER — Telehealth: Payer: Self-pay

## 2024-02-28 ENCOUNTER — Other Ambulatory Visit: Payer: Self-pay | Admitting: Student

## 2024-02-28 MED ORDER — DULOXETINE HCL 60 MG PO CPEP
60.0000 mg | ORAL_CAPSULE | Freq: Every day | ORAL | 3 refills | Status: AC
  Filled 2024-02-28: qty 90, 90d supply, fill #0

## 2024-02-28 NOTE — Telephone Encounter (Signed)
 Copied from CRM #[id number]. Topic: Clinical - Prescription Issue >> Feb 28, 2024  1:45 PM Avram MATSU wrote: Reason for CRM: patient stated her medication was not called in for DULoxetine (CYMBALTA) DR capsule 60 mg  [[id number]]. Patient is hear at the pharmacy   [facility] [address] [place] [address] Phone: [phone] Fax: [phone]

## 2024-03-02 ENCOUNTER — Other Ambulatory Visit: Payer: Self-pay

## 2024-03-02 NOTE — Telephone Encounter (Signed)
 Called [facility] pharmacy to check on the status of the prescription and pharmacist informed me that the Rx was received and patient picked up prescription on Friday, Nov. 14,2025.

## 2024-03-19 ENCOUNTER — Encounter: Admitting: Student

## 2024-04-02 ENCOUNTER — Ambulatory Visit: Admitting: Student

## 2024-04-06 ENCOUNTER — Encounter: Admitting: Student

## 2024-04-06 ENCOUNTER — Encounter: Payer: Self-pay | Admitting: Student

## 2024-04-06 NOTE — Progress Notes (Signed)
 Attempted to connect to patient for video visit today, however unable to hear audio on video call. Attempted to start visit on another workstation however patient was no longer on the call. Unable to reach patient on phone x3. Left VM for patient to call back to reschedule visit.

## 2024-05-29 ENCOUNTER — Encounter: Admitting: Student
